# Patient Record
Sex: Female | Born: 1980 | Race: Black or African American | Hispanic: No | Marital: Single | Smoking: Current every day smoker
Health system: Southern US, Community
[De-identification: ages and names within clinical notes are randomized; demographics above are authoritative.]

## PROBLEM LIST (undated history)

## (undated) HISTORY — PX: TUBAL LIGATION: SHX77

---

## 2004-11-03 ENCOUNTER — Emergency Department: Payer: Self-pay | Admitting: Emergency Medicine

## 2005-03-12 ENCOUNTER — Emergency Department: Payer: Self-pay | Admitting: Emergency Medicine

## 2005-09-24 ENCOUNTER — Emergency Department: Payer: Self-pay | Admitting: General Practice

## 2006-06-29 ENCOUNTER — Emergency Department: Payer: Self-pay | Admitting: Unknown Physician Specialty

## 2006-09-29 ENCOUNTER — Emergency Department: Payer: Self-pay | Admitting: Emergency Medicine

## 2006-12-24 ENCOUNTER — Emergency Department: Payer: Self-pay | Admitting: Emergency Medicine

## 2006-12-27 ENCOUNTER — Emergency Department: Payer: Self-pay | Admitting: Emergency Medicine

## 2007-05-25 ENCOUNTER — Emergency Department: Payer: Self-pay | Admitting: Emergency Medicine

## 2007-11-24 ENCOUNTER — Emergency Department: Payer: Self-pay | Admitting: Internal Medicine

## 2008-06-11 ENCOUNTER — Emergency Department: Payer: Self-pay | Admitting: Emergency Medicine

## 2009-07-15 ENCOUNTER — Emergency Department: Payer: Self-pay | Admitting: Emergency Medicine

## 2009-11-24 ENCOUNTER — Emergency Department (HOSPITAL_COMMUNITY): Admission: EM | Admit: 2009-11-24 | Discharge: 2009-11-24 | Payer: Self-pay | Admitting: Family Medicine

## 2010-03-20 ENCOUNTER — Emergency Department (HOSPITAL_COMMUNITY): Admission: EM | Admit: 2010-03-20 | Discharge: 2010-03-20 | Payer: Self-pay | Admitting: Emergency Medicine

## 2011-01-07 ENCOUNTER — Emergency Department: Payer: Self-pay | Admitting: Emergency Medicine

## 2011-03-02 ENCOUNTER — Emergency Department: Payer: Self-pay | Admitting: Unknown Physician Specialty

## 2011-07-09 ENCOUNTER — Emergency Department: Payer: Self-pay | Admitting: Internal Medicine

## 2013-05-29 ENCOUNTER — Emergency Department: Payer: Self-pay | Admitting: Emergency Medicine

## 2013-06-18 ENCOUNTER — Inpatient Hospital Stay: Payer: Self-pay | Admitting: Psychiatry

## 2013-06-18 LAB — COMPREHENSIVE METABOLIC PANEL
Albumin: 4.1 g/dL (ref 3.4–5.0)
Anion Gap: 4 — ABNORMAL LOW (ref 7–16)
BUN: 11 mg/dL (ref 7–18)
Calcium, Total: 8.9 mg/dL (ref 8.5–10.1)
Glucose: 109 mg/dL — ABNORMAL HIGH (ref 65–99)
Osmolality: 276 (ref 275–301)
SGPT (ALT): 45 U/L (ref 12–78)
Sodium: 138 mmol/L (ref 136–145)
Total Protein: 8 g/dL (ref 6.4–8.2)

## 2013-06-18 LAB — URINALYSIS, COMPLETE
Bilirubin,UR: NEGATIVE
Hyaline Cast: 2
Nitrite: NEGATIVE
Ph: 5 (ref 4.5–8.0)
Protein: NEGATIVE
RBC,UR: 1 /HPF (ref 0–5)
Squamous Epithelial: 2

## 2013-06-18 LAB — TSH: Thyroid Stimulating Horm: 1.09 u[IU]/mL

## 2013-06-18 LAB — DRUG SCREEN, URINE
Amphetamines, Ur Screen: NEGATIVE (ref ?–1000)
Benzodiazepine, Ur Scrn: NEGATIVE (ref ?–200)
Cannabinoid 50 Ng, Ur ~~LOC~~: NEGATIVE (ref ?–50)
MDMA (Ecstasy)Ur Screen: NEGATIVE (ref ?–500)
Tricyclic, Ur Screen: NEGATIVE (ref ?–1000)

## 2013-06-18 LAB — ETHANOL: Ethanol %: <0.003 % (ref 0.000–0.080)

## 2013-06-18 LAB — CBC: MCH: 28.7 pg (ref 26.0–34.0)

## 2013-06-22 ENCOUNTER — Ambulatory Visit: Payer: Self-pay

## 2013-06-22 ENCOUNTER — Emergency Department: Payer: Self-pay | Admitting: Emergency Medicine

## 2013-06-24 ENCOUNTER — Ambulatory Visit: Payer: Self-pay

## 2014-02-08 LAB — URINALYSIS W/ REFLEX CULTURE
Bacteria: NEGATIVE /hpf
Bilirubin: NEGATIVE
Glucose: NEGATIVE mg/dL
Ketone: NEGATIVE mg/dL
Nitrites: NEGATIVE
Protein: NEGATIVE mg/dL
Specific gravity: 1.019 (ref 1.003–1.030)
Urobilinogen: 0.2 EU/dL (ref 0.2–1.0)
pH (UA): 7 (ref 5.0–8.0)

## 2014-02-08 LAB — HCG URINE, QL: HCG urine, QL: NEGATIVE

## 2014-02-08 NOTE — ED Provider Notes (Signed)
Patient is a 33 y.o. female presenting with urinary pain.   Urinary Pain          No past medical history on file.     No past surgical history on file.      No family history on file.     History     Social History   ??? Marital Status: N/A     Spouse Name: N/A     Number of Children: N/A   ??? Years of Education: N/A     Occupational History   ??? Not on file.     Social History Main Topics   ??? Smoking status: Not on file   ??? Smokeless tobacco: Not on file   ??? Alcohol Use: Not on file   ??? Drug Use: Not on file   ??? Sexual Activity: Not on file     Other Topics Concern   ??? Not on file     Social History Narrative   ??? No narrative on file                  ALLERGIES: Review of patient's allergies indicates no known allergies.      Review of Systems    Filed Vitals:    02/08/14 1056   BP: 123/78   Pulse: 74   Temp: 98.1 ??F (36.7 ??C)   Resp: 18   Weight: 85.2 kg (187 lb 13.3 oz)   SpO2: 100%            Physical Exam     MDM    Procedures  1:34 PM    I was inadvertently assigned to this patient's treatment team.  I did not see this patient nor did I have any contact with this patient.  I had no involvement during the evaluation, treatment or disposition of this patient.  I am signing off this note to indicate only why my name appeared in the record.  Blenda Nicelyiffany Tyron Manetta, [place]

## 2014-02-08 NOTE — ED Notes (Signed)
Patient to treatment room for third attempt. Patient assumed to have eloped.

## 2014-02-08 NOTE — ED Notes (Signed)
Called twice to treatment room. No response.

## 2014-02-11 NOTE — Progress Notes (Signed)
02/11/14  Pt provided a urine sample while in triage during her 02/08/14 visit. Pt was called multiple times to be roomed. Pt LWBS. She did not notify anyone prior to leaving. There is no contact phone number or address listed in chart.  Spoke w/ lab. Urine culture was not performed although reflex culture was ordered w/ urinalysis  Negative pregnancy test.     There is no contact information for pt. Unable to send letter due to no address on file and unable to call pt due to no phone number on file. Pt also does not have a PCP listed, so I am also unable to contact PCP or pt re: results    Spoke w/ lab again, they will culture pt's urine in case she returns to the ED

## 2014-02-12 NOTE — Progress Notes (Signed)
Quick Note:    LWBS, no abx.   Dede QueryLacey Shamecca Whitebread, PA    ______

## 2014-02-13 LAB — CULTURE, URINE
Colonies Counted: 6000
Colony Count: 6000

## 2014-02-13 NOTE — Progress Notes (Signed)
Quick Note:    Only 6,000 colonies. No patient information on file, unable to contact.   Dede QueryLacey Niti Leisure, PA    ______
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
## 2015-04-18 ENCOUNTER — Emergency Department
Admission: EM | Admit: 2015-04-18 | Discharge: 2015-04-18 | Disposition: A | Discharge status: Home / Self Care | Payer: No Typology Code available for payment source | Attending: Emergency Medicine | Admitting: Emergency Medicine

## 2015-04-18 ENCOUNTER — Encounter: Payer: Self-pay | Admitting: Emergency Medicine

## 2015-04-18 DIAGNOSIS — M7918 Myalgia, other site: Secondary | ICD-10-CM

## 2015-04-18 DIAGNOSIS — S161XXA Strain of muscle, fascia and tendon at neck level, initial encounter: Secondary | ICD-10-CM

## 2015-04-18 DIAGNOSIS — Y998 Other external cause status: Secondary | ICD-10-CM | POA: Diagnosis not present

## 2015-04-18 DIAGNOSIS — Y92410 Unspecified street and highway as the place of occurrence of the external cause: Secondary | ICD-10-CM | POA: Insufficient documentation

## 2015-04-18 DIAGNOSIS — Y9389 Activity, other specified: Secondary | ICD-10-CM | POA: Insufficient documentation

## 2015-04-18 DIAGNOSIS — Z720 Tobacco use: Secondary | ICD-10-CM | POA: Diagnosis not present

## 2015-04-18 DIAGNOSIS — S199XXA Unspecified injury of neck, initial encounter: Secondary | ICD-10-CM | POA: Diagnosis present

## 2015-04-18 MED ORDER — CYCLOBENZAPRINE HCL 10 MG PO TABS: 10.0000 mg | ORAL_TABLET | Freq: Three times a day (TID) | ORAL | Status: DC | PRN

## 2015-04-18 MED ORDER — NAPROXEN 500 MG PO TABS
500.0000 mg | ORAL_TABLET | Freq: Once | ORAL | Status: AC
  Administered 2015-04-18: 500 mg via ORAL

## 2015-04-18 MED ORDER — NAPROXEN 500 MG PO TABS
500.0000 mg | ORAL_TABLET | Freq: Three times a day (TID) | ORAL | Status: DC
Start: 2015-04-18 — End: 2018-03-18

## 2015-04-18 MED ORDER — CYCLOBENZAPRINE HCL 10 MG PO TABS
ORAL_TABLET | ORAL | Status: AC
  Administered 2015-04-18: 10 mg via ORAL
  Filled 2015-04-18: qty 1

## 2015-04-18 MED ORDER — NAPROXEN 500 MG PO TABS
ORAL_TABLET | ORAL | Status: AC
  Administered 2015-04-18: 500 mg via ORAL
  Filled 2015-04-18: qty 1

## 2015-04-18 MED ORDER — CYCLOBENZAPRINE HCL 10 MG PO TABS
10.0000 mg | ORAL_TABLET | Freq: Once | ORAL | Status: AC
  Administered 2015-04-18: 10 mg via ORAL

## 2015-04-18 NOTE — ED Notes (Signed)
Pt alert and oriented X4, active, cooperative, pt in NAD. RR even and unlabored, color WNL.  Pt informed to return if any life threatening symptoms occur.   
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
## 2015-04-18 NOTE — Discharge Instructions (Signed)
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
## 2015-09-30 ENCOUNTER — Encounter (HOSPITAL_COMMUNITY): Payer: Self-pay | Admitting: Nurse Practitioner

## 2015-09-30 ENCOUNTER — Emergency Department (HOSPITAL_COMMUNITY)
Admission: EM | Admit: 2015-09-30 | Discharge: 2015-09-30 | Disposition: A | Discharge status: Home / Self Care | Payer: Medicaid Other | Attending: Emergency Medicine | Admitting: Emergency Medicine

## 2015-09-30 DIAGNOSIS — X58XXXA Exposure to other specified factors, initial encounter: Secondary | ICD-10-CM | POA: Diagnosis not present

## 2015-09-30 DIAGNOSIS — T148 Other injury of unspecified body region: Secondary | ICD-10-CM | POA: Insufficient documentation

## 2015-09-30 DIAGNOSIS — R200 Anesthesia of skin: Secondary | ICD-10-CM | POA: Diagnosis not present

## 2015-09-30 DIAGNOSIS — Y9289 Other specified places as the place of occurrence of the external cause: Secondary | ICD-10-CM | POA: Diagnosis not present

## 2015-09-30 DIAGNOSIS — Y998 Other external cause status: Secondary | ICD-10-CM | POA: Insufficient documentation

## 2015-09-30 DIAGNOSIS — M79601 Pain in right arm: Secondary | ICD-10-CM | POA: Diagnosis present

## 2015-09-30 DIAGNOSIS — IMO0002 Reserved for concepts with insufficient information to code with codable children: Secondary | ICD-10-CM

## 2015-09-30 DIAGNOSIS — R202 Paresthesia of skin: Secondary | ICD-10-CM | POA: Insufficient documentation

## 2015-09-30 DIAGNOSIS — F17200 Nicotine dependence, unspecified, uncomplicated: Secondary | ICD-10-CM | POA: Insufficient documentation

## 2015-09-30 DIAGNOSIS — Z791 Long term (current) use of non-steroidal anti-inflammatories (NSAID): Secondary | ICD-10-CM | POA: Insufficient documentation

## 2015-09-30 DIAGNOSIS — Y9389 Activity, other specified: Secondary | ICD-10-CM | POA: Diagnosis not present

## 2015-09-30 DIAGNOSIS — T148XXA Other injury of unspecified body region, initial encounter: Secondary | ICD-10-CM

## 2015-09-30 MED ORDER — PREDNISONE 20 MG PO TABS: 40.0000 mg | ORAL_TABLET | Freq: Every day | ORAL | Status: DC

## 2015-09-30 MED ORDER — IBUPROFEN 800 MG PO TABS: 800.0000 mg | ORAL_TABLET | Freq: Three times a day (TID) | ORAL | Status: DC

## 2015-09-30 NOTE — ED Notes (Signed)
She c/o 2 month history of R neck pain radiating into R arm. Describes as a "shooting" pain. Over past 3 days she has begun to feel some intermittent numbness in R thumb and pointer fingertips. Pt was seen at another hospital 2 months ago for same pain with dx of muscle strain, she does not remember any injuries prior to onset of pain. She has been doing exercises given to her by hospital  with no relief of pain. A&ox4, ambulatory, mae.

## 2015-09-30 NOTE — ED Provider Notes (Signed)
CSN: [id number]     Arrival date & time 09/30/15  1159 History  By signing my name below, I, Elon SpannerGarrett Cook, attest that this documentation has been prepared under the direction and in the presence of Joycie PeekBenjamin Kysha Muralles. Electronically Signed: Elon SpannerGarrett Cook ED Scribe. 09/30/2015. 1:59 PM.  Chief Complaint  Patient presents with  . Arm Pain   The history is provided by the patient. No language interpreter was used.   HPI Comments: Sheena Petty is a 34 y.o. female who presents to the Emergency Department complaining of right-sided neck pain with intermittent tingling pain down the right arm into the fingers onset 2 months ago that became constant 4 days ago.   Certain motions of the neck cause shooting pain down the right arm.  She has used ibuprofen without relief.  Patient reports she was seen two months ago at another hospital and diagnosed with strain in her neck.  She denies injury, weakness, fever.     History reviewed. No pertinent past medical history. Past Surgical History  Procedure Laterality Date  . Cesarean section    . Tubal ligation     History reviewed. No pertinent family history. Social History  Substance Use Topics  . Smoking status: Current Some Day Smoker  . Smokeless tobacco: None  . Alcohol Use: Yes   OB History    No data available     Review of Systems  A complete 10 system review of systems was obtained and all systems are negative except as noted in the HPI and PMH.     Allergies  Review of patient's allergies indicates no known allergies.  Home Medications   Prior to Admission medications   Medication Sig Start Date End Date Taking? Authorizing Provider  cyclobenzaprine (FLEXERIL) 10 MG tablet Take 1 tablet (10 mg total) by mouth 3 (three) times daily as needed for muscle spasms. 04/18/15   Chinita Pesterari B Triplett, FNP  ibuprofen (ADVIL,MOTRIN) 800 MG tablet Take 1 tablet (800 mg total) by mouth 3 (three) times daily. 09/30/15   Joycie PeekBenjamin Dezi Brauner, PA-C   naproxen (NAPROSYN) 500 MG tablet Take 1 tablet (500 mg total) by mouth 3 (three) times daily with meals. 04/18/15   Chinita Pesterari B Triplett, FNP  predniSONE (DELTASONE) 20 MG tablet Take 2 tablets (40 mg total) by mouth daily. 09/30/15   Joycie PeekBenjamin Mckinnley Smithey, PA-C   BP 107/72 mmHg  Pulse 72  Temp(Src) 98 F (36.7 C) (Oral)  Resp 18  Ht 5\' 2"  (1.575 m)  Wt 82.283 kg  BMI 33.17 kg/m2  SpO2 96% Physical Exam  Constitutional: She is oriented to person, place, and time. She appears well-developed and well-nourished. No distress.  HENT:  Head: Normocephalic and atraumatic.  Eyes: Conjunctivae and EOM are normal.  Neck: Normal range of motion. Neck supple. No tracheal deviation present.  No carotid bruits.   Cardiovascular: Normal rate, regular rhythm, normal heart sounds and intact distal pulses.   Pulmonary/Chest: Effort normal. No respiratory distress.  Musculoskeletal: Normal range of motion.  c-spine, t-spine normal with no bony tenderness. Mild TTP of right scalenes.  Neurological: She is alert and oriented to person, place, and time.  Negative Tinel's. Motor strength appears baseline, grip strength intact and equal bilaterally. Sensation intact to light touch.  Skin: Skin is warm and dry. No rash noted.  Psychiatric: She has a normal mood and affect. Her behavior is normal.  Nursing note and vitals reviewed.   ED Course  Procedures (including critical care time)  DIAGNOSTIC STUDIES:  Oxygen Saturation is 98% on RA, normal by my interpretation.    COORDINATION OF CARE:   1:32 PM Will prescribe ibuprofen and oral steroids.  Patient acknowledges and agrees with plan.    Labs Review Labs Reviewed - No data to display  Imaging Review No results found. I have personally reviewed and evaluated these images and lab results as part of my medical decision-making.   EKG Interpretation None     Meds given in ED:  Medications - No data to display  Discharge Medication List as of  09/30/2015  1:35 PM    START taking these medications   Details  ibuprofen (ADVIL,MOTRIN) 800 MG tablet Take 1 tablet (800 mg total) by mouth 3 (three) times daily., Starting 09/30/2015, Until Discontinued, Print    predniSONE (DELTASONE) 20 MG tablet Take 2 tablets (40 mg total) by mouth daily., Starting 09/30/2015, Until Discontinued, Print       Filed Vitals:   09/30/15 1213 09/30/15 1335  BP: 126/85 107/72  Pulse: 76 72  Temp: 98.2 F (36.8 C) 98 F (36.7 C)  TempSrc: Oral Oral  Resp: 16 18  Height:  (1.575 m)   Weight: 82.283 kg   SpO2: 98% 96%    MDM  Sheena Petty is a 34 y.o. female comes in for evaluation of right-sided neck pain and intermittent sharp and tingling pain in her right fingers. On arrival, she is hemodynamically stable, vital signs normal, afebrile. On exam, she is neurovascularly intact, maintains full active range of motion. No midline cervical bony tenderness, full range of motion of cervical, thoracic and lumbar spine. She does have mild tenderness to palpation of her right scalenes. Suspect some degree of muscle strain. Patient reports she only takes "2 or 3 ibuprofen ". Plan to administer steroid taper and increased dose of ibuprofen and have patient follow-up with PCP. Referral given to [facility] and wellness. Patient verbalizes understanding. Voices no other questions or concerns this time. Overall, she appears well, he wouldn't stable. Low suspicion for acute or emergent spinal cord pathology. Stable for discharge Final diagnoses:  Muscle strain  Occasional numbness/prickling/tingling of fingers and toes    I personally performed the services described in this documentation, which was scribed in my presence. The recorded information has been reviewed and is accurate.     Joycie Peek, PA-C 09/30/15 1400  Gerhard Munch, MD 10/06/15 [phone]

## 2015-09-30 NOTE — Discharge Instructions (Signed)
Please take your medications as prescribed. Do not take your ibuprofen with Aleve, naproxen or Advil as these medications worked similarly. Take your prednisone as prescribed. Follow-up with your doctor or the [facility] and wellness facility for follow-up primary care. Return to ED for any new or worsening symptoms.  Paresthesia Paresthesia is an abnormal burning or prickling sensation. This sensation is generally felt in the hands, arms, legs, or feet. However, it may occur in any part of the body. Usually, it is not painful. The feeling may be described as:  Tingling or numbness.  Pins and needles.  Skin crawling.  Buzzing.  Limbs falling asleep.  Itching. Most people experience temporary (transient) paresthesia at some time in their lives. Paresthesia may occur when you breathe too quickly (hyperventilation). It can also occur without any apparent cause. Commonly, paresthesia occurs when pressure is placed on a nerve. The sensation quickly goes away after the pressure is removed. For some people, however, paresthesia is a long-lasting (chronic) condition that is caused by an underlying disorder. If you continue to have paresthesia, you may need further medical evaluation. HOME CARE INSTRUCTIONS Watch your condition for any changes. Taking the following actions may help to lessen any discomfort that you are feeling:  Avoid drinking alcohol.  Try acupuncture or massage to help relieve your symptoms.  Keep all follow-up visits as directed by your health care provider. This is important. SEEK MEDICAL CARE IF:  You continue to have episodes of paresthesia.  Your burning or prickling feeling gets worse when you walk.  You have pain, cramps, or dizziness.  You develop a rash. SEEK IMMEDIATE MEDICAL CARE IF:  You feel weak.  You have trouble walking or moving.  You have problems with speech, understanding, or vision.  You feel confused.  You cannot control your bladder or  bowel movements.  You have numbness after an injury.  You faint.   This information is not intended to replace advice given to you by your health care provider. Make sure you discuss any questions you have with your health care provider.   Document Released: 10/10/2002 Document Revised: 03/06/2015 Document Reviewed: 10/16/2014 Elsevier Interactive Patient Education [employer]2016 Elsevier Inc.

## 2017-02-12 ENCOUNTER — Emergency Department (HOSPITAL_COMMUNITY): Payer: Medicaid Other

## 2017-02-12 ENCOUNTER — Emergency Department (HOSPITAL_COMMUNITY)
Admission: EM | Admit: 2017-02-12 | Discharge: 2017-02-12 | Disposition: A | Discharge status: Home / Self Care | Payer: Medicaid Other | Attending: Emergency Medicine | Admitting: Emergency Medicine

## 2017-02-12 ENCOUNTER — Encounter (HOSPITAL_COMMUNITY): Payer: Self-pay

## 2017-02-12 DIAGNOSIS — Y929 Unspecified place or not applicable: Secondary | ICD-10-CM | POA: Insufficient documentation

## 2017-02-12 DIAGNOSIS — Y999 Unspecified external cause status: Secondary | ICD-10-CM | POA: Insufficient documentation

## 2017-02-12 DIAGNOSIS — K5900 Constipation, unspecified: Secondary | ICD-10-CM

## 2017-02-12 DIAGNOSIS — Y939 Activity, unspecified: Secondary | ICD-10-CM | POA: Insufficient documentation

## 2017-02-12 DIAGNOSIS — F17200 Nicotine dependence, unspecified, uncomplicated: Secondary | ICD-10-CM | POA: Insufficient documentation

## 2017-02-12 DIAGNOSIS — S39011A Strain of muscle, fascia and tendon of abdomen, initial encounter: Secondary | ICD-10-CM | POA: Insufficient documentation

## 2017-02-12 DIAGNOSIS — X58XXXA Exposure to other specified factors, initial encounter: Secondary | ICD-10-CM | POA: Insufficient documentation

## 2017-02-12 LAB — COMPREHENSIVE METABOLIC PANEL
ALT: 18 U/L (ref 14–54)
ANION GAP: 7 (ref 5–15)
AST: 21 U/L (ref 15–41)
Albumin: 4.1 g/dL (ref 3.5–5.0)
Alkaline Phosphatase: 63 U/L (ref 38–126)
BUN: 11 mg/dL (ref 6–20)
CHLORIDE: 104 mmol/L (ref 101–111)
CO2: 27 mmol/L (ref 22–32)
CREATININE: 0.82 mg/dL (ref 0.44–1.00)
Calcium: 8.7 mg/dL — ABNORMAL LOW (ref 8.9–10.3)
Glucose, Bld: 101 mg/dL — ABNORMAL HIGH (ref 65–99)
POTASSIUM: 3.5 mmol/L (ref 3.5–5.1)
SODIUM: 138 mmol/L (ref 135–145)
Total Bilirubin: 0.5 mg/dL (ref 0.3–1.2)
Total Protein: 6.7 g/dL (ref 6.5–8.1)

## 2017-02-12 LAB — URINALYSIS, ROUTINE W REFLEX MICROSCOPIC
Bacteria, UA: NONE SEEN
Bilirubin Urine: NEGATIVE
GLUCOSE, UA: NEGATIVE mg/dL
KETONES UR: NEGATIVE mg/dL
LEUKOCYTES UA: NEGATIVE
Nitrite: NEGATIVE
PH: 6 (ref 5.0–8.0)
Protein, ur: NEGATIVE mg/dL
Specific Gravity, Urine: 1.018 (ref 1.005–1.030)

## 2017-02-12 LAB — CBC
HCT: 36.9 % (ref 36.0–46.0)
HEMOGLOBIN: 11.7 g/dL — AB (ref 12.0–15.0)
MCH: 27.4 pg (ref 26.0–34.0)
MCHC: 31.7 g/dL (ref 30.0–36.0)
MCV: 86.4 fL (ref 78.0–100.0)
PLATELETS: 289 10*3/uL (ref 150–400)
RBC: 4.27 MIL/uL (ref 3.87–5.11)
RDW: 14 % (ref 11.5–15.5)
WBC: 12.7 10*3/uL — AB (ref 4.0–10.5)

## 2017-02-12 LAB — I-STAT BETA HCG BLOOD, ED (MC, WL, AP ONLY): I-stat hCG, quantitative: <5 m[IU]/mL (ref <5)

## 2017-02-12 LAB — LIPASE, BLOOD: LIPASE: 28 U/L (ref 11–51)

## 2017-02-12 MED ORDER — CYCLOBENZAPRINE HCL 5 MG PO TABS: 5.0000 mg | ORAL_TABLET | Freq: Three times a day (TID) | ORAL | 0 refills | Status: DC | PRN

## 2017-02-12 MED ORDER — MAGNESIUM CITRATE PO SOLN: 1.0000 | Freq: Once | ORAL | 1 refills | Status: AC

## 2017-02-12 NOTE — Discharge Instructions (Signed)
X-ray shows that you have constipation.  Her physical exam is consistent with a slight muscle strain of your abdominal wall to be given a prescription for Flexeril 5 mg, take 1 tablet 3 times a day.  Try to avoid heavy lifting for the next 7-10 days. You've also been given a prescription for magnesium citrate.  Please take this with a full glass of water and this should help relieve some of your constipation tried add more roughage to your diet and drink more water

## 2017-02-12 NOTE — ED Provider Notes (Signed)
[facility]-EMERGENCY DEPT Provider Note   CSN: [id number] Arrival date & time: 02/12/17  1721     History   Chief Complaint Chief Complaint  Patient presents with  . Abdominal Pain    HPI Sheena Petty is a 36 y.o. female.  This a normally healthy 36 year old female who is moderately obese.  He states for the past 2 weeks.  She's noticed a sore area in her mid abdomen, worse when she lifts or in certain positions.  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  She has not taken any medication for discomfort. She reports no diarrhea, no constipation, no dysuria, no vaginal discharge.  She states that she does have one or 2 episodes of vomiting a week after eating certain foods, but nothing on a regular basis.       History reviewed. No pertinent past medical history.  There are no active problems to display for this patient.   Past Surgical History:  Procedure Laterality Date  . CESAREAN SECTION    . TUBAL LIGATION      OB History    No data available       Home Medications    Prior to Admission medications   Medication Sig Start Date End Date Taking? Authorizing Provider  cyclobenzaprine (FLEXERIL) 5 MG tablet Take 1 tablet (5 mg total) by mouth 3 (three) times daily as needed for muscle spasms. 02/12/17   Earley Favor, NP  ibuprofen (ADVIL,MOTRIN) 800 MG tablet Take 1 tablet (800 mg total) by mouth 3 (three) times daily. 09/30/15   Joycie Peek, PA-C  magnesium citrate SOLN Take 296 mLs (1 Bottle total) by mouth once. 02/12/17 02/12/17  Earley Favor, NP  naproxen (NAPROSYN) 500 MG tablet Take 1 tablet (500 mg total) by mouth 3 (three) times daily with meals. 04/18/15   Chinita Pester, FNP  predniSONE (DELTASONE) 20 MG tablet Take 2 tablets (40 mg total) by mouth daily. 09/30/15   Joycie Peek, PA-C    Family History No family history on file.  Social History Social History  Substance Use Topics  . Smoking status: Current Some Day Smoker   . Smokeless tobacco: Never Used  . Alcohol use Yes     Allergies   Patient has no known allergies.   Review of Systems Review of Systems  Constitutional: Negative for appetite change.  HENT: Negative.   Eyes: Negative.   Respiratory: Negative for shortness of breath.   Cardiovascular: Negative for chest pain.  Gastrointestinal: Positive for abdominal pain and vomiting. Negative for constipation, diarrhea, nausea and rectal pain.  Endocrine: Negative.   Genitourinary: Negative for dysuria, vaginal bleeding and vaginal discharge.  Musculoskeletal: Negative for back pain, myalgias and neck pain.  Neurological: Negative for weakness and headaches.  Psychiatric/Behavioral: Negative.   All other systems reviewed and are negative.    Physical Exam Updated Vital Signs BP 117/83   Pulse 73   Temp 98.4 F (36.9 C) (Oral)   Resp 16   Ht  (1.575 m)   Wt 81.6 kg   LMP 02/01/2017 Comment: patient states tubes clampped  SpO2 100%   BMI 32.92 kg/m   Physical Exam  Constitutional: She appears well-developed and well-nourished.  HENT:  Head: Normocephalic.  Nose: Nose normal.  Eyes: Pupils are equal, round, and reactive to light.  Neck: Normal range of motion.  Cardiovascular: Normal rate.   Pulmonary/Chest: Effort normal.  Abdominal: Soft. She exhibits no distension.  Musculoskeletal: Normal range of motion.  Neurological: She is alert.  Skin: Skin is warm.  Psychiatric: She has a normal mood and affect.  Nursing note and vitals reviewed.    ED Treatments / Results  Labs (all labs ordered are listed, but only abnormal results are displayed) Labs Reviewed  COMPREHENSIVE METABOLIC PANEL - Abnormal; Notable for the following:       Result Value   Glucose, Bld 101 (*)    Calcium 8.7 (*)    All other components within normal limits  CBC - Abnormal; Notable for the following:    WBC 12.7 (*)    Hemoglobin 11.7 (*)    All other components within normal limits    URINALYSIS, ROUTINE W REFLEX MICROSCOPIC - Abnormal; Notable for the following:    Hgb urine dipstick MODERATE (*)    Squamous Epithelial / LPF 0-5 (*)    All other components within normal limits  LIPASE, BLOOD  I-STAT BETA HCG BLOOD, ED ([facility], [facility], [facility] ONLY)    EKG  EKG Interpretation None       Radiology Dg Abdomen 1 View  Result Date: 02/12/2017 CLINICAL DATA:  Mid abdominal pain EXAM: ABDOMEN - 1 VIEW COMPARISON:  None. FINDINGS: The bowel gas pattern is normal. A moderate degree of fecal retention is seen within large bowel. Tubal ligation clips are seen within the pelvis bilaterally. Small phleboliths are seen within the pelvis bilaterally as well. A moderate No radio-opaque calculi or other significant radiographic abnormality are seen. IMPRESSION: No bowel obstruction. Moderate fecal retention within large bowel question constipation. Electronically Signed   By: Tollie Eth M.D.   On: 02/12/2017 21:46    Procedures Procedures (including critical care time)  Medications Ordered in ED Medications - No data to display   Initial Impression / Assessment and Plan / ED Course  I have reviewed the triage vital signs and the nursing notes.  Pertinent labs & imaging results that were available during my care of the patient were reviewed by me and considered in my medical decision making (see chart for details).     \ Knee shows significant constipation.  Patient has been given a prescription for magnesium citrate also feel that she has a muscle strain of her abdominal wall and a put on weight restriction at work, not lifting more than 25 pounds in the next 7-10 days with a prescription for Flexeril  Final Clinical Impressions(s) / ED Diagnoses   Final diagnoses:  Constipation, unspecified constipation type  Abdominal muscle strain, initial encounter    New Prescriptions New Prescriptions   MAGNESIUM CITRATE SOLN    Take 296 mLs (1 Bottle total) by mouth once.     Earley Favor, NP 02/12/17 1191    Arby Barrette, MD 02/16/17 1048

## 2017-02-12 NOTE — ED Triage Notes (Signed)
Pt reports central constant sharp abdominal pain "it feels like a knot" onset one week ago associated with nausea. She denies vomiting or diarrhea. LBM yesterday and states it was normal.

## 2017-09-12 ENCOUNTER — Emergency Department (HOSPITAL_COMMUNITY)
Admission: EM | Admit: 2017-09-12 | Discharge: 2017-09-12 | Disposition: A | Discharge status: Home / Self Care | Payer: Self-pay | Attending: Emergency Medicine | Admitting: Emergency Medicine

## 2017-09-12 ENCOUNTER — Other Ambulatory Visit: Payer: Self-pay

## 2017-09-12 DIAGNOSIS — F17210 Nicotine dependence, cigarettes, uncomplicated: Secondary | ICD-10-CM | POA: Insufficient documentation

## 2017-09-12 DIAGNOSIS — F4321 Adjustment disorder with depressed mood: Secondary | ICD-10-CM

## 2017-09-12 DIAGNOSIS — Z79899 Other long term (current) drug therapy: Secondary | ICD-10-CM | POA: Insufficient documentation

## 2017-09-12 DIAGNOSIS — F410 Panic disorder [episodic paroxysmal anxiety] without agoraphobia: Secondary | ICD-10-CM

## 2017-09-12 DIAGNOSIS — F4320 Adjustment disorder, unspecified: Secondary | ICD-10-CM | POA: Insufficient documentation

## 2017-09-12 NOTE — ED Triage Notes (Signed)
Pt bib family and abruptly lost her two sons last night.  Pt reports needing medication for her nerves. Pt a/o 4 and ambulatory.

## 2017-09-12 NOTE — ED Notes (Signed)
Attempted to give discharge instructions to pt.  Pt not in room.

## 2017-09-12 NOTE — ED Provider Notes (Signed)
[facility]-EMERGENCY DEPT Provider Note   CSN: [id number] Arrival date & time: 09/12/17  1216     History   Chief Complaint Chief Complaint  Patient presents with  . Anxiety    HPI Sheena Petty is a 36 y.o. female.  HPI  36 y.o. female, presents to the Emergency Department today due to panic attack and episode of grieving. Pt notes that she lost her sons this morning and is requesting medications for her nerves. Notes difficulty sleeping due to this. Denies pain. No CP/SOB/ABD pain. No N/V/D. No fevers. Notes that the news was hard for her and her family and is requesting help. No other symptoms noted    No past medical history on file.  There are no active problems to display for this patient.   Past Surgical History:  Procedure Laterality Date  . CESAREAN SECTION    . TUBAL LIGATION      OB History    No data available       Home Medications    Prior to Admission medications   Medication Sig Start Date End Date Taking? Authorizing Provider  cyclobenzaprine (FLEXERIL) 5 MG tablet Take 1 tablet (5 mg total) by mouth 3 (three) times daily as needed for muscle spasms. 02/12/17   Earley FavorSchulz, Gail, NP  ibuprofen (ADVIL,MOTRIN) 800 MG tablet Take 1 tablet (800 mg total) by mouth 3 (three) times daily. 09/30/15   Cartner, Sharlet SalinaBenjamin, PA-C  naproxen (NAPROSYN) 500 MG tablet Take 1 tablet (500 mg total) by mouth 3 (three) times daily with meals. 04/18/15   Triplett, Cari B, FNP  predniSONE (DELTASONE) 20 MG tablet Take 2 tablets (40 mg total) by mouth daily. 09/30/15   Joycie Peekartner, Benjamin, PA-C    Family History No family history on file.  Social History Social History   Tobacco Use  . Smoking status: Current Some Day Smoker  . Smokeless tobacco: Never Used  Substance Use Topics  . Alcohol use: Yes  . Drug use: No     Allergies   Patient has no known allergies.   Review of Systems Review of Systems ROS reviewed and all are negative for  acute change except as noted in the HPI.  Physical Exam Updated Vital Signs BP 118/85 (BP Location: Right Arm)   Pulse 83   Temp 98.3 F (36.8 C) (Oral)   Resp (!) 24   LMP 08/17/2017   SpO2 100%   Physical Exam  Constitutional: She is oriented to person, place, and time. She appears well-developed and well-nourished. No distress.  HENT:  Head: Normocephalic and atraumatic.  Right Ear: Tympanic membrane, external ear and ear canal normal.  Left Ear: Tympanic membrane, external ear and ear canal normal.  Nose: Nose normal.  Mouth/Throat: Uvula is midline, oropharynx is clear and moist and mucous membranes are normal. No trismus in the jaw. No oropharyngeal exudate, posterior oropharyngeal erythema or tonsillar abscesses.  Eyes: EOM are normal. Pupils are equal, round, and reactive to light.  Neck: Normal range of motion. Neck supple. No tracheal deviation present.  Cardiovascular: Normal rate, regular rhythm, S1 normal, S2 normal, normal heart sounds, intact distal pulses and normal pulses.  Pulmonary/Chest: Effort normal and breath sounds normal. No respiratory distress. She has no decreased breath sounds. She has no wheezes. She has no rhonchi. She has no rales.  Abdominal: Normal appearance and bowel sounds are normal. There is no tenderness.  Musculoskeletal: Normal range of motion.  Neurological: She is alert and oriented to  person, place, and time.  Skin: Skin is warm and dry.  Psychiatric: She has a normal mood and affect. Her speech is normal and behavior is normal. Thought content normal.     ED Treatments / Results  Labs (all labs ordered are listed, but only abnormal results are displayed) Labs Reviewed - No data to display  EKG  EKG Interpretation None       Radiology No results found.  Procedures Procedures (including critical care time)  Medications Ordered in ED Medications - No data to display   Initial Impression / Assessment and Plan / ED Course    I have reviewed the triage vital signs and the nursing notes.  Pertinent labs & imaging results that were available during my care of the patient were reviewed by me and considered in my medical decision making (see chart for details).  Final Clinical Impressions(s) / ED Diagnoses     {I have reviewed the relevant previous healthcare records.  {I obtained HPI from historian.   ED Course:  Assessment: Pt is a 36 y.o. female presents to the Emergency Department today due to panic attack and episode of grieving. Pt notes that she lost her sons this morning and is requesting medications for her nerves. Notes difficulty sleeping due to this. Denies pain. No CP/SOB/ABD pain. No N/V/D. No fevers. Notes that the news was hard for her and her family and is requesting help. On exam, pt in NAD. Nontoxic/nonseptic appearing. VSS. Afebrile. Lungs CTA. Heart RRR. Likely brief panic attack given grief. Denies other symptoms. Given outpatient grief counseling. Discussed techniques to assist in grief. Plan is to DC home with follow up to PCP. At time of discharge, Patient is in no acute distress. Vital Signs are stable. Patient is able to ambulate. Patient able to tolerate PO.   Disposition/Plan:  DC Home Additional Verbal discharge instructions given and discussed with patient.  Pt Instructed to f/u with PCP in the next week for evaluation and treatment of symptoms. Return precautions given Pt acknowledges and agrees with plan  Supervising Physician Arby BarrettePfeiffer, Marcy, MD  Final diagnoses:  Panic attack  Grief    ED Discharge Orders    None       Audry PiliMohr, Cordel Drewes, PA-C 09/12/17 1323    Arby BarrettePfeiffer, Marcy, MD 09/12/17 1520

## 2018-03-18 ENCOUNTER — Encounter (HOSPITAL_COMMUNITY): Payer: Self-pay | Admitting: Family Medicine

## 2018-03-18 ENCOUNTER — Ambulatory Visit (HOSPITAL_COMMUNITY)
Admission: EM | Admit: 2018-03-18 | Discharge: 2018-03-18 | Disposition: A | Discharge status: Home / Self Care | Payer: Self-pay | Attending: Family Medicine | Admitting: Family Medicine

## 2018-03-18 DIAGNOSIS — R0981 Nasal congestion: Secondary | ICD-10-CM

## 2018-03-18 DIAGNOSIS — M546 Pain in thoracic spine: Secondary | ICD-10-CM

## 2018-03-18 DIAGNOSIS — K529 Noninfective gastroenteritis and colitis, unspecified: Secondary | ICD-10-CM

## 2018-03-18 MED ORDER — CYCLOBENZAPRINE HCL 10 MG PO TABS: 10.0000 mg | ORAL_TABLET | Freq: Two times a day (BID) | ORAL | 0 refills | Status: DC | PRN

## 2018-03-18 MED ORDER — ONDANSETRON 4 MG PO TBDP: 4.0000 mg | ORAL_TABLET | Freq: Three times a day (TID) | ORAL | 0 refills | Status: DC | PRN

## 2018-03-18 NOTE — ED Provider Notes (Signed)
[facility]    CSN: [id number] Arrival date & time: 03/18/18  1132     History   Chief Complaint Chief Complaint  Patient presents with  . Emesis  . Diarrhea    HPI Sheena Petty is a 37 y.o. female no contributing past medical history presenting today for evaluation of multiple complaints.  Her main concern is she has been having nausea vomiting and diarrhea for the past 2 days.  Overall symptoms have been improving, she is no longer actively vomiting, but is still having some nausea.  Tolerating fluids, but is still not tolerating solid foods.  Diarrhea has also improved and is not as frequent.  Denies any abdominal pain at this time, states that she will occasionally have some irritation but nothing persistent.  Denies any blood in the vomit or stools.  Denies any fevers.  Also endorsing some neck and back pain that has been going on for the past week.  States that she recently switched jobs and has had increase in activity/lifting.  Has been taking ibuprofen 600 with minimal relief.  Denies any numbness or tingling or radiation into her arms.  Denies loss of bowel or bladder control, denies any saddle anesthesia.  Denies any changes in vision.  Patient also noting to have nasal congestion.  Denies any urinary symptoms of dysuria, increased frequency or urgency.  Denies any abnormal vaginal discharge.  Last menstrual period began 2 days ago.  HPI  History reviewed. No pertinent past medical history.  There are no active problems to display for this patient.   Past Surgical History:  Procedure Laterality Date  . CESAREAN SECTION    . TUBAL LIGATION      OB History   None      Home Medications    Prior to Admission medications   Medication Sig Start Date End Date Taking? Authorizing Provider  cyclobenzaprine (FLEXERIL) 10 MG tablet Take 1 tablet (10 mg total) by mouth 2 (two) times daily as needed for muscle spasms. 03/18/18   Lancelot Alyea C, PA-C    ondansetron (ZOFRAN ODT) 4 MG disintegrating tablet Take 1 tablet (4 mg total) by mouth every 8 (eight) hours as needed for nausea or vomiting. 03/18/18   Aleeta Schmaltz, Junius Creamer, PA-C    Family History History reviewed. No pertinent family history.  Social History Social History   Tobacco Use  . Smoking status: Current Some Day Smoker  . Smokeless tobacco: Never Used  Substance Use Topics  . Alcohol use: Yes  . Drug use: No     Allergies   Patient has no known allergies.   Review of Systems Review of Systems  Constitutional: Positive for appetite change. Negative for activity change, chills, fatigue and fever.  HENT: Positive for congestion and rhinorrhea. Negative for ear pain, sinus pressure, sore throat and trouble swallowing.   Eyes: Negative for pain and visual disturbance.  Respiratory: Positive for cough. Negative for chest tightness and shortness of breath.   Cardiovascular: Negative for chest pain.  Gastrointestinal: Positive for diarrhea, nausea and vomiting. Negative for abdominal pain.  Musculoskeletal: Positive for back pain, myalgias, neck pain and neck stiffness. Negative for arthralgias and gait problem.  Skin: Negative for color change, rash and wound.  Neurological: Negative for dizziness, seizures, syncope, weakness, light-headedness, numbness and headaches.     Physical Exam Triage Vital Signs ED Triage Vitals  Enc Vitals Group     BP 03/18/18 1154 (!) 113/95     Pulse Rate  03/18/18 1154 84     Resp 03/18/18 1154 18     Temp 03/18/18 1154 98.3 F (36.8 C)     Temp src --      SpO2 03/18/18 1154 100 %     Weight --      Height --      Head Circumference --      Peak Flow --      Pain Score 03/18/18 1152 0     Pain Loc --      Pain Edu? --      Excl. in GC? --    No data found.  Updated Vital Signs BP (!) 113/95   Pulse 84   Temp 98.3 F (36.8 C)   Resp 18   LMP 03/16/2018   SpO2 100%   Visual Acuity Right Eye Distance:   Left Eye  Distance:   Bilateral Distance:    Right Eye Near:   Left Eye Near:    Bilateral Near:     Physical Exam  Constitutional: She appears well-developed and well-nourished. No distress.  HENT:  Head: Normocephalic and atraumatic.  Mouth/Throat: Oropharynx is clear and moist.  Tongue does appear slightly fissured  Eyes: Pupils are equal, round, and reactive to light. Conjunctivae and EOM are normal.  Neck: Neck supple.  Cardiovascular: Normal rate and regular rhythm.  No murmur heard. Pulmonary/Chest: Effort normal and breath sounds normal. No respiratory distress.  Breathing comfortably at rest, CTABL, no wheezing, rales or other adventitious sounds auscultated   Abdominal: Soft. There is no tenderness.  Abdomen is soft, nondistended, nontender to light and deep palpation, negative rebound.  Musculoskeletal: She exhibits no edema.  Patient with tenderness to palpation around left scapular thoracic area.  Full active range of motion of neck.  Neurological: She is alert.  Skin: Skin is warm and dry.  Psychiatric: She has a normal mood and affect.  Nursing note and vitals reviewed.    UC Treatments / Results  Labs (all labs ordered are listed, but only abnormal results are displayed) Labs Reviewed - No data to display  EKG None  Radiology No results found.  Procedures Procedures (including critical care time)  Medications Ordered in UC Medications - No data to display  Initial Impression / Assessment and Plan / UC Course  I have reviewed the triage vital signs and the nursing notes.  Pertinent labs & imaging results that were available during my care of the patient were reviewed by me and considered in my medical decision making (see chart for details).     Nausea vomiting diarrhea that are improving, likely viral gastroenteritis.  Will recommend symptomatic management, Zofran provided.  Discussed importance of dehydration.  Back pain likely muscular, no injury.   Will defer imaging.  Will treat with anti-inflammatories, will add in Flexeril to use as needed, discussed sedation regarding this and advised to not use when working or to mainly reserve for bedtime.  Discussed over-the-counter recommendations for nasal congestion.  Discussed strict return precautions. Patient verbalized understanding and is agreeable with plan.  Final Clinical Impressions(s) / UC Diagnoses   Final diagnoses:  Gastroenteritis  Acute left-sided thoracic back pain  Nasal congestion     Discharge Instructions     Nausea/Vomiting/Diarrhea: Your nausea, vomiting, and diarrhea appear to have a viral cause. Your symptoms should improve over the next week as your body continues to rid the infectious cause.  For nausea: Zofran prescribed. Begin with every 6 hours, than as you are  able to hold food down, take it as needed. Start with clear liquids, then move to plain foods like bananas, rice, applesauce, toast, broth, grits, oatmeal. As those food settle okay you may transition to your normal foods. Avoid spicy and greasy foods as much as possible.  For Diarrhea: This is your body's natural way of getting rid of a virus. You may try taking 1 imodium to decrease amount of stools a day, but we do not want you to stop your diarrhea.   Preventing dehydration is key! You need to replace the fluid your body is expelling. Drink plenty of fluids, may use Pedialyte or sports drinks.   Please return if you are experiencing blood in your vomit or stool or experiencing dizziness, lightheadedness, extreme fatigue, increased abdominal pain.   Back/Neck Pain: Use anti-inflammatories for pain/swelling. You may take up to 800 mg Ibuprofen every 8 hours with food. You may supplement Ibuprofen with Tylenol [phone] mg every 8 hours.   You may use flexeril as needed to help with pain. This is a muscle relaxer and causes sedation- please use only at bedtime or when you will be home and not have to  drive/work  Ice and heating pad  Congestion: For congestion you may try a daily allergy pill like Zyrtec or Claritin, store brand is okay or you may try oral decongestants like Mucinex or Sudafed, or nasal sprays like Nasacort or Flonase.  Please return if symptoms worsening or develop difficulty breathing.   ED Prescriptions    Medication Sig Dispense Auth. Provider   ondansetron (ZOFRAN ODT) 4 MG disintegrating tablet Take 1 tablet (4 mg total) by mouth every 8 (eight) hours as needed for nausea or vomiting. 20 tablet Rosalin Buster C, PA-C   cyclobenzaprine (FLEXERIL) 10 MG tablet Take 1 tablet (10 mg total) by mouth 2 (two) times daily as needed for muscle spasms. 20 tablet Tangia Pinard, [place] C, PA-C     Controlled Substance Prescriptions [place] Controlled Substance Registry consulted? Not Applicable   Lew Dawes, [place] 03/18/18 1247

## 2018-03-18 NOTE — Discharge Instructions (Signed)
Nausea/Vomiting/Diarrhea: Your nausea, vomiting, and diarrhea appear to have a viral cause. Your symptoms should improve over the next week as your body continues to rid the infectious cause.  For nausea: Zofran prescribed. Begin with every 6 hours, than as you are able to hold food down, take it as needed. Start with clear liquids, then move to plain foods like bananas, rice, applesauce, toast, broth, grits, oatmeal. As those food settle okay you may transition to your normal foods. Avoid spicy and greasy foods as much as possible.  For Diarrhea: This is your body's natural way of getting rid of a virus. You may try taking 1 imodium to decrease amount of stools a day, but we do not want you to stop your diarrhea.   Preventing dehydration is key! You need to replace the fluid your body is expelling. Drink plenty of fluids, may use Pedialyte or sports drinks.   Please return if you are experiencing blood in your vomit or stool or experiencing dizziness, lightheadedness, extreme fatigue, increased abdominal pain.   Back/Neck Pain: Use anti-inflammatories for pain/swelling. You may take up to 800 mg Ibuprofen every 8 hours with food. You may supplement Ibuprofen with Tylenol [phone] mg every 8 hours.   You may use flexeril as needed to help with pain. This is a muscle relaxer and causes sedation- please use only at bedtime or when you will be home and not have to drive/work  Ice and heating pad  Congestion: For congestion you may try a daily allergy pill like Zyrtec or Claritin, store brand is okay or you may try oral decongestants like Mucinex or Sudafed, or nasal sprays like Nasacort or Flonase.  Please return if symptoms worsening or develop difficulty breathing.

## 2018-03-18 NOTE — ED Triage Notes (Addendum)
Pt here for N,V,D since Monday. Pt sts some neck and back tightness since last week.

## 2018-12-22 ENCOUNTER — Encounter (HOSPITAL_COMMUNITY): Payer: Self-pay | Admitting: Emergency Medicine

## 2018-12-22 ENCOUNTER — Other Ambulatory Visit: Payer: Self-pay

## 2018-12-22 ENCOUNTER — Emergency Department (HOSPITAL_COMMUNITY)
Admission: EM | Admit: 2018-12-22 | Discharge: 2018-12-22 | Disposition: A | Discharge status: Home / Self Care | Payer: Self-pay | Attending: Emergency Medicine | Admitting: Emergency Medicine

## 2018-12-22 DIAGNOSIS — Z5321 Procedure and treatment not carried out due to patient leaving prior to being seen by health care provider: Secondary | ICD-10-CM | POA: Insufficient documentation

## 2018-12-22 DIAGNOSIS — R1032 Left lower quadrant pain: Secondary | ICD-10-CM | POA: Insufficient documentation

## 2018-12-22 NOTE — ED Notes (Addendum)
Patient refused lab draw.

## 2018-12-22 NOTE — ED Triage Notes (Signed)
Pt c/o left lower abd pains for couple days. Denies n/v/d or urinary problems.

## 2019-01-19 ENCOUNTER — Other Ambulatory Visit: Payer: Self-pay

## 2019-01-19 ENCOUNTER — Encounter (HOSPITAL_COMMUNITY): Payer: Self-pay | Admitting: Family Medicine

## 2019-01-19 ENCOUNTER — Ambulatory Visit (HOSPITAL_COMMUNITY)
Admission: EM | Admit: 2019-01-19 | Discharge: 2019-01-19 | Disposition: A | Discharge status: Home / Self Care | Payer: Self-pay | Attending: Family Medicine | Admitting: Family Medicine

## 2019-01-19 DIAGNOSIS — R6889 Other general symptoms and signs: Secondary | ICD-10-CM

## 2019-01-19 MED ORDER — BENZONATATE 100 MG PO CAPS: 100.0000 mg | ORAL_CAPSULE | Freq: Three times a day (TID) | ORAL | 0 refills | Status: DC | PRN

## 2019-01-19 MED ORDER — OSELTAMIVIR PHOSPHATE 75 MG PO CAPS: 75.0000 mg | ORAL_CAPSULE | Freq: Two times a day (BID) | ORAL | 0 refills | Status: DC

## 2019-01-19 NOTE — ED Triage Notes (Signed)
Runny nose, stuffy nose, cough and congestion and chest soreness with inspiration.  Onset Monday of symptoms

## 2019-01-19 NOTE — ED Provider Notes (Signed)
[facility]    CSN: [id number] Arrival date & time: 01/19/19  1414     History   Chief Complaint Chief Complaint  Patient presents with  . URI    HPI Sheena Petty is a 38 y.o. female.   Runny nose, stuffy nose, cough and congestion and chest soreness with inspiration.  Onset Monday of symptoms. Her brother tested positive for the flu last Saturday, and her symptoms began two days later.  She is not short of breath, no vomiting, no sore throat or ear ache.  She works in Biomedical scientist.       History reviewed. No pertinent past medical history.  There are no active problems to display for this patient.   Past Surgical History:  Procedure Laterality Date  . CESAREAN SECTION    . TUBAL LIGATION      OB History   No obstetric history on file.      Home Medications    Prior to Admission medications   Medication Sig Start Date End Date Taking? Authorizing Provider  benzonatate (TESSALON) 100 MG capsule Take 1-2 capsules (100-200 mg total) by mouth 3 (three) times daily as needed for cough. 01/19/19   Elvina Sidle, MD  oseltamivir (TAMIFLU) 75 MG capsule Take 1 capsule (75 mg total) by mouth every 12 (twelve) hours. 01/19/19   Elvina Sidle, MD    Family History History reviewed. No pertinent family history.  Social History Social History   Tobacco Use  . Smoking status: Current Some Day Smoker  . Smokeless tobacco: Never Used  Substance Use Topics  . Alcohol use: Yes  . Drug use: No     Allergies   Patient has no known allergies.   Review of Systems Review of Systems   Physical Exam Triage Vital Signs ED Triage Vitals [01/19/19 1503]  Enc Vitals Group     BP      Pulse      Resp      Temp      Temp src      SpO2      Weight      Height      Head Circumference      Peak Flow      Pain Score 7     Pain Loc      Pain Edu?      Excl. in GC?    No data found.  Updated Vital Signs BP 131/85 (BP Location: Left  Arm)   Pulse 80   Temp 98.5 F (36.9 C) (Oral)   Resp 18   LMP 12/18/2018   SpO2 100%    Physical Exam Vitals signs and nursing note reviewed.  Constitutional:      Appearance: Normal appearance. She is obese.  HENT:     Head: Normocephalic.     Right Ear: Tympanic membrane and external ear normal.     Left Ear: Tympanic membrane and external ear normal.     Nose: Nose normal.     Mouth/Throat:     Mouth: Mucous membranes are moist.     Pharynx: Oropharynx is clear.  Eyes:     Conjunctiva/sclera: Conjunctivae normal.     Pupils: Pupils are equal, round, and reactive to light.  Neck:     Musculoskeletal: Normal range of motion and neck supple.  Cardiovascular:     Rate and Rhythm: Normal rate and regular rhythm.     Heart sounds: Normal heart sounds.  Pulmonary:  Effort: Pulmonary effort is normal.     Breath sounds: Normal breath sounds.  Musculoskeletal: Normal range of motion.  Skin:    General: Skin is warm and dry.  Neurological:     General: No focal deficit present.     Mental Status: She is alert and oriented to person, place, and time.     Gait: Gait normal.  Psychiatric:        Mood and Affect: Mood normal.        Thought Content: Thought content normal.      UC Treatments / Results  Labs (all labs ordered are listed, but only abnormal results are displayed) Labs Reviewed - No data to display  EKG None  Radiology No results found.  Procedures Procedures (including critical care time)  Medications Ordered in UC Medications - No data to display  Initial Impression / Assessment and Plan / UC Course  I have reviewed the triage vital signs and the nursing notes.  Pertinent labs & imaging results that were available during my care of the patient were reviewed by me and considered in my medical decision making (see chart for details).    Final Clinical Impressions(s) / UC Diagnoses   Final diagnoses:  Flu-like symptoms   Discharge  Instructions   None    ED Prescriptions    Medication Sig Dispense Auth. Provider   oseltamivir (TAMIFLU) 75 MG capsule Take 1 capsule (75 mg total) by mouth every 12 (twelve) hours. 10 capsule Elvina Sidle, MD   benzonatate (TESSALON) 100 MG capsule Take 1-2 capsules (100-200 mg total) by mouth 3 (three) times daily as needed for cough. 40 capsule Elvina Sidle, MD     Controlled Substance Prescriptions  Controlled Substance Registry consulted? Not Applicable   Elvina Sidle, MD 01/19/19 1515

## 2019-03-18 ENCOUNTER — Encounter (HOSPITAL_COMMUNITY): Payer: Self-pay

## 2019-03-18 ENCOUNTER — Emergency Department
Admission: EM | Admit: 2019-03-18 | Discharge: 2019-03-18 | Disposition: A | Discharge status: Home / Self Care | Payer: Medicaid Other

## 2019-03-18 ENCOUNTER — Other Ambulatory Visit: Payer: Self-pay

## 2019-03-18 ENCOUNTER — Ambulatory Visit (HOSPITAL_COMMUNITY)
Admission: EM | Admit: 2019-03-18 | Discharge: 2019-03-18 | Disposition: A | Discharge status: Home / Self Care | Payer: Medicaid Other | Attending: Family Medicine | Admitting: Family Medicine

## 2019-03-18 DIAGNOSIS — R1032 Left lower quadrant pain: Secondary | ICD-10-CM

## 2019-03-18 LAB — POCT URINALYSIS DIP (DEVICE)
Bilirubin Urine: NEGATIVE
Glucose, UA: NEGATIVE mg/dL
Ketones, ur: NEGATIVE mg/dL
Leukocytes,Ua: NEGATIVE
Nitrite: NEGATIVE
Protein, ur: NEGATIVE mg/dL
Specific Gravity, Urine: 1.025 (ref 1.005–1.030)
Urobilinogen, UA: 0.2 mg/dL (ref 0.0–1.0)
pH: 6 (ref 5.0–8.0)

## 2019-03-18 LAB — POCT PREGNANCY, URINE: Preg Test, Ur: NEGATIVE

## 2019-03-18 MED ORDER — NAPROXEN 500 MG PO TABS: 500.0000 mg | ORAL_TABLET | Freq: Two times a day (BID) | ORAL | 0 refills | Status: DC

## 2019-03-18 NOTE — ED Notes (Signed)
Patient called x 2.  No response.

## 2019-03-18 NOTE — Discharge Instructions (Signed)
Naproxen twice a day, take with food.  Heat pack application.  Your urine does not have any indications of infection.  Kidney stone, gas/stool, or ovarian cyst are all possible causes of this location of pain.  I think it is appropriate to follow up with your gynecologist given your history.  However, if develop worsening of symptoms prior to that visit- increased pain, fevers, urinary symptoms or blood in urine, nausea or vomiting- or otherwise worsening, please go to the ER.

## 2019-03-18 NOTE — ED Triage Notes (Signed)
Pt states hx of ovarian cysts, c/o pain to lt side. States has an appt with her OBGYN on Tuesday but needs something for pain until.

## 2019-03-19 NOTE — ED Provider Notes (Signed)
[facility]    CSN: [id number] Arrival date & time: 03/18/19  1921     History   Chief Complaint Chief Complaint  Patient presents with  . Flank Pain    HPI Sheena Petty is a 38 y.o. female.   Sheena Petty presents with LLQ pain which started a few days ago and is persistent. Worse last night. Started a few days ago with the onset of her period, 5/11. Mild nausea and headache. Headache has improved. Pain 8/10 in severity. Has taken motrin previously which has helped some, hasn't taken tonight. No fevers. No urinary symptoms. No vaginal complaints. No diarrhea or constipation. States has had a ruptured ovarian cyst in the past and this feels exactly the same. She has an appointment with gynecology 5/19. States she feels she could use prescription strength NSAID to better control her pain before her appointment. She is not on any birth control. Has had a tubal ligation. Hx of c-section, no other abdominal surgeries.     ROS per HPI, negative if not otherwise mentioned.      History reviewed. No pertinent past medical history.  There are no active problems to display for this patient.   Past Surgical History:  Procedure Laterality Date  . CESAREAN SECTION    . TUBAL LIGATION      OB History   No obstetric history on file.      Home Medications    Prior to Admission medications   Medication Sig Start Date End Date Taking? Authorizing Provider  naproxen (NAPROSYN) 500 MG tablet Take 1 tablet (500 mg total) by mouth 2 (two) times daily. 03/18/19   Georgetta Haber, NP    Family History No family history on file.  Social History Social History   Tobacco Use  . Smoking status: Current Some Day Smoker  . Smokeless tobacco: Never Used  Substance Use Topics  . Alcohol use: Yes  . Drug use: No     Allergies   Patient has no known allergies.   Review of Systems Review of Systems   Physical Exam Triage Vital Signs ED Triage Vitals   Enc Vitals Group     BP 03/18/19 1934 120/88     Pulse Rate 03/18/19 1934 (!) 109     Resp 03/18/19 1934 18     Temp 03/18/19 1934 98.1 F (36.7 C)     Temp Source 03/18/19 1934 Oral     SpO2 03/18/19 1934 100 %     Weight --      Height --      Head Circumference --      Peak Flow --      Pain Score 03/18/19 1935 8     Pain Loc --      Pain Edu? --      Excl. in GC? --    No data found.  Updated Vital Signs BP 120/88 (BP Location: Right Arm)   Pulse (!) 109   Temp 98.1 F (36.7 C) (Oral)   Resp 18   LMP 03/14/2019   SpO2 100%    Physical Exam Constitutional:      General: She is not in acute distress.    Appearance: She is well-developed.  Cardiovascular:     Rate and Rhythm: Regular rhythm. Tachycardia present.     Heart sounds: Normal heart sounds.  Pulmonary:     Effort: Pulmonary effort is normal.     Breath sounds: Normal breath sounds.  Abdominal:  Tenderness: There is abdominal tenderness in the left lower quadrant.  Genitourinary:    General: Normal vulva.     Vagina: Normal.     Cervix: No cervical motion tenderness, discharge, friability or cervical bleeding.     Uterus: Normal.      Adnexa:        Left: Tenderness present. No fullness.       Comments: LLQ abdominal as well as adnexal tenderness on palpation  Skin:    General: Skin is warm and dry.  Neurological:     Mental Status: She is alert and oriented to person, place, and time.      UC Treatments / Results  Labs (all labs ordered are listed, but only abnormal results are displayed) Labs Reviewed  POCT URINALYSIS DIP (DEVICE) - Abnormal; Notable for the following components:      Result Value   Hgb urine dipstick SMALL (*)    All other components within normal limits  POC URINE PREG, ED  POCT PREGNANCY, URINE  CERVICOVAGINAL ANCILLARY ONLY    EKG None  Radiology No results found.  Procedures Procedures (including critical care time)  Medications Ordered in UC  Medications - No data to display  Initial Impression / Assessment and Plan / UC Course  I have reviewed the triage vital signs and the nursing notes.  Pertinent labs & imaging results that were available during my care of the patient were reviewed by me and considered in my medical decision making (see chart for details).     hgb in urine, recent menstruation vs kidney stone? Diverticulitis vs nephrolithiasis vs ovarian cyst considered. Slight tachycardia, patient is in discomfort. Afebrile. No guarding or rebound tenderness. Do feel its reasonable to trial naproxen 500mg  tabs with strict ER precautions. Follow up with gyn as scheduled.declines toradol in clinic tonight. Patient verbalized understanding and agreeable to plan.  Ambulatory out of clinic without difficulty.    Final Clinical Impressions(s) / UC Diagnoses   Final diagnoses:  Abdominal pain, left lower quadrant     Discharge Instructions     Naproxen twice a day, take with food.  Heat pack application.  Your urine does not have any indications of infection.  Kidney stone, gas/stool, or ovarian cyst are all possible causes of this location of pain.  I think it is appropriate to follow up with your gynecologist given your history.  However, if develop worsening of symptoms prior to that visit- increased pain, fevers, urinary symptoms or blood in urine, nausea or vomiting- or otherwise worsening, please go to the ER.    ED Prescriptions    Medication Sig Dispense Auth. Provider   naproxen (NAPROSYN) 500 MG tablet Take 1 tablet (500 mg total) by mouth 2 (two) times daily. 30 tablet Georgetta HaberBurky, Maggi Hershkowitz B, NP     Controlled Substance Prescriptions [place] Controlled Substance Registry consulted? Not Applicable   Georgetta HaberBurky, Kalid Ghan B, NP 03/19/19 [phone]

## 2019-03-22 ENCOUNTER — Telehealth (HOSPITAL_COMMUNITY): Payer: Self-pay | Admitting: Emergency Medicine

## 2019-03-22 LAB — CERVICOVAGINAL ANCILLARY ONLY
Bacterial vaginitis: POSITIVE — AB
Candida vaginitis: NEGATIVE
Chlamydia: NEGATIVE
Neisseria Gonorrhea: NEGATIVE
Trichomonas: NEGATIVE

## 2019-03-22 MED ORDER — METRONIDAZOLE 500 MG PO TABS: 500.0000 mg | ORAL_TABLET | Freq: Two times a day (BID) | ORAL | 0 refills | Status: AC

## 2019-03-22 NOTE — Telephone Encounter (Signed)
Bacterial vaginosis is positive. This was not treated at the urgent care visit.  Flagyl 500 mg BID x 7 days #14 no refills sent to patients pharmacy of choice.    Patient contacted and made aware of all results, all questions answered.  

## 2019-08-05 ENCOUNTER — Ambulatory Visit (HOSPITAL_COMMUNITY)
Admission: EM | Admit: 2019-08-05 | Discharge: 2019-08-05 | Disposition: A | Discharge status: Home / Self Care | Payer: Self-pay | Attending: Family Medicine | Admitting: Family Medicine

## 2019-08-05 ENCOUNTER — Encounter (HOSPITAL_COMMUNITY): Payer: Self-pay

## 2019-08-05 ENCOUNTER — Other Ambulatory Visit: Payer: Self-pay

## 2019-08-05 DIAGNOSIS — M25531 Pain in right wrist: Secondary | ICD-10-CM

## 2019-08-05 MED ORDER — MELOXICAM 7.5 MG PO TABS: 7.5000 mg | ORAL_TABLET | Freq: Every day | ORAL | 0 refills | Status: DC

## 2019-08-05 NOTE — ED Triage Notes (Signed)
Patient presents to Urgent Care with complaints of right wrist pain since 2 days ago. Patient reports she does repetitive movement at work and is worried she has carpal tunnel.

## 2019-08-05 NOTE — ED Provider Notes (Signed)
[facility]    CSN: [id number] Arrival date & time: 08/05/19  1544      History   Chief Complaint Chief Complaint  Patient presents with  . Wrist Pain    HPI Sheena Petty is a 38 y.o. female.   Sheena Petty presents with complaints of right wrist pain which started a few days ago. Feels "deep" with a tingling sensation, sometimes radiates to proximal thumb. Hears and feels a popping sensation with flexion at times now as well. No numbness. When she wakes it feels stiff and achy. No known injury. No redness. Sometimes she feels it appears swollen. She is right handed. She works loading and unloading boxes. Has been off of work for the past few days, however. Has been taking tylenol and motrin which have not helped. Denies any previous issue with her wrist or other orthopedic history. Without contributing medical history.     ROS per HPI, negative if not otherwise mentioned.      History reviewed. No pertinent past medical history.  There are no active problems to display for this patient.   Past Surgical History:  Procedure Laterality Date  . Sheena Petty    . Sheena Petty      OB History   No obstetric history on file.      Home Medications    Prior to Admission medications   Medication Sig Start Date End Date Taking? Authorizing Provider  meloxicam (MOBIC) 7.5 MG tablet Take 1 tablet (7.5 mg total) by mouth daily. 08/05/19   Georgetta Haber, NP    Family History Family History  Problem Relation Age of Onset  . Asthma Mother   . Hypertension Mother   . Healthy Father     Social History Social History   Tobacco Use  . Smoking status: Current Some Day Smoker    Packs/day: 0.30    Types: Cigarettes  . Smokeless tobacco: Never Used  . Tobacco comment: 6 per day  Substance Use Topics  . Alcohol use: Yes  . Drug use: No     Allergies   Patient has no known allergies.   Review of Systems Review of Systems   Physical  Exam Triage Vital Signs ED Triage Vitals  Enc Vitals Group     BP 08/05/19 1608 138/86     Pulse Rate 08/05/19 1608 92     Resp 08/05/19 1608 16     Temp 08/05/19 1608 98.3 F (36.8 C)     Temp Source 08/05/19 1608 Oral     SpO2 08/05/19 1608 98 %     Weight --      Height --      Head Circumference --      Peak Flow --      Pain Score 08/05/19 1606 8     Pain Loc --      Pain Edu? --      Excl. in GC? --    No data found.  Updated Vital Signs BP 138/86 (BP Location: Left Arm)   Pulse 92   Temp 98.3 F (36.8 C) (Oral)   Resp 16   SpO2 98%    Physical Exam Constitutional:      General: She is not in acute distress.    Appearance: She is well-developed.  Cardiovascular:     Rate and Rhythm: Normal rate.  Pulmonary:     Effort: Pulmonary effort is normal.  Musculoskeletal:     Right wrist: She exhibits  tenderness and crepitus. She exhibits normal range of motion, no bony tenderness, no swelling, no effusion, no deformity and no laceration.     Right hand: Normal.     Comments: Tenderness at midline wrist without specific bony tenderness; pain with flexion, no pain with extension; crepitus with rotation noted; negative phalen and tinel sign; strong radial pulse; cap refill < 2 seconds; full ROM of fingers   Skin:    General: Skin is warm and dry.  Neurological:     Mental Status: She is alert and oriented to person, place, and time.      UC Treatments / Results  Labs (all labs ordered are listed, but only abnormal results are displayed) Labs Reviewed - No data to display  EKG   Radiology No results found.  Procedures Procedures (including critical care time)  Medications Ordered in UC Medications - No data to display  Initial Impression / Assessment and Plan / UC Course  I have reviewed the triage vital signs and the nursing notes.  Pertinent labs & imaging results that were available during my care of the patient were reviewed by me and considered  in my medical decision making (see chart for details).     No obviously carpel tunnel, which is what patient is worried about. Seems more likely general arthritic pain. No injury, imaging deferred at this time. Will brace, ice, nsaids for pain. Follow up with sports medicine and/or ortho for further eval. Patient verbalized understanding and agreeable to plan.   Final Clinical Impressions(s) / UC Diagnoses   Final diagnoses:  Right wrist pain     Discharge Instructions     Use of brace, especially while active.  Meloxicam, take with food. Don't take additional motrin.  Please follow up sports medicine and/or ortho for persistent symptoms as you may need further treatment.    ED Prescriptions    Medication Sig Dispense Auth. Provider   meloxicam (MOBIC) 7.5 MG tablet Take 1 tablet (7.5 mg total) by mouth daily. 20 tablet Zigmund Gottron, NP     PDMP not reviewed this encounter.   Zigmund Gottron, NP 08/05/19 [phone]

## 2019-08-05 NOTE — Discharge Instructions (Signed)
Use of brace, especially while active.  Meloxicam, take with food. Don't take additional motrin.  Please follow up sports medicine and/or ortho for persistent symptoms as you may need further treatment.

## 2019-08-12 ENCOUNTER — Ambulatory Visit: Payer: Medicaid Other | Admitting: Sports Medicine

## 2019-08-17 ENCOUNTER — Other Ambulatory Visit: Payer: Self-pay

## 2019-08-17 ENCOUNTER — Ambulatory Visit (INDEPENDENT_AMBULATORY_CARE_PROVIDER_SITE_OTHER): Payer: Self-pay | Admitting: Sports Medicine

## 2019-08-17 ENCOUNTER — Encounter: Payer: Self-pay | Admitting: Sports Medicine

## 2019-08-17 VITALS — BP 116/88 | Ht 62.0 in | Wt 175.0 lb

## 2019-08-17 DIAGNOSIS — M778 Other enthesopathies, not elsewhere classified: Secondary | ICD-10-CM

## 2019-08-17 MED ORDER — MELOXICAM 15 MG PO TABS: 15.0000 mg | ORAL_TABLET | Freq: Every day | ORAL | 0 refills | Status: DC | PRN

## 2019-08-17 NOTE — Patient Instructions (Signed)
Your wrist pain is caused by tendinitis of your right extensor carpi ulnaris - You should continue to wear the wrist brace for the next 2 weeks - I have provided you with a work note for light duty for the next 2 weeks - We will increase the dose of your meloxicam to 15 mg daily - You may take Tylenol in addition to the meloxicam but not take other anti-inflammatories  We will have you follow-up in 2 weeks.  At that time if your pain is not improved we will consider sending you to occupational therapy versus getting additional imaging

## 2019-08-17 NOTE — Progress Notes (Addendum)
PCP: [facility]  Subjective:   HPI: Patient is a 38 y.o. female here for evaluation of right wrist pain.  Pain is been present for the last 2 weeks.  She denies any injury or trauma.  The pain is located on the ulnar aspect of her wrist.  The pain does not radiate.  She has no bruising or swelling, she denies any numbness or tingling.  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  This seems to aggravate her pain.  She was seen at urgent care who gave her a wrist brace which she notes has helped improve her pain however she still continues to aggravated at work.  She was also given a prescription for meloxicam 7.5 mg daily which improves her pain slightly but she still notes discomfort.  She denies any previous injury or trauma to her wrist.   Review of Systems: See HPI above.  History reviewed. No pertinent past medical history.  Current Outpatient Medications on File Prior to Visit  Medication Sig Dispense Refill  . meloxicam (MOBIC) 7.5 MG tablet Take 1 tablet (7.5 mg total) by mouth daily. 20 tablet 0   No current facility-administered medications on file prior to visit.     Past Surgical History:  Procedure Laterality Date  . CESAREAN SECTION    . TUBAL LIGATION      No Known Allergies  Social History   Socioeconomic History  . Marital status: Single    Spouse name: Not on file  . Number of children: Not on file  . Years of education: Not on file  . Highest education level: Not on file  Occupational History  . Not on file  Social Needs  . Financial resource strain: Not on file  . Food insecurity    Worry: Not on file    Inability: Not on file  . Transportation needs    Medical: Not on file    Non-medical: Not on file  Tobacco Use  . Smoking status: Current Some Day Smoker    Packs/day: 0.30    Types: Cigarettes  . Smokeless tobacco: Never Used  . Tobacco comment: 6 per day  Substance  and Sexual Activity  . Alcohol use: Yes  . Drug use: No  . Sexual activity: Never    Birth control/protection: Surgical  Lifestyle  . Physical activity    Days per week: Not on file    Minutes per session: Not on file  . Stress: Not on file  Relationships  . Social Herbalist on phone: Not on file    Gets together: Not on file    Attends religious service: Not on file    Active member of club or organization: Not on file    Attends meetings of clubs or organizations: Not on file    Relationship status: Not on file  . Intimate partner violence    Fear of current or ex partner: Not on file    Emotionally abused: Not on file    Physically abused: Not on file    Forced sexual activity: Not on file  Other Topics Concern  . Not on file  Social History Narrative  . Not on file    Family History  Problem Relation Age of Onset  . Asthma Mother   . Hypertension Mother   . Healthy Father         Objective:  Physical Exam: BP 116/88  Ht 5\' 2"  (1.575 m)   Wt 175 lb (79.4 kg)   BMI 32.01 kg/m  Gen: NAD, comfortable in exam room Lungs: Breathing comfortably on room air Hand/wrist Exam Right -Inspection: No deformity, no discoloration -Palpation: Tenderness palpation over the TFCC and extensor carpi ulnaris -ROM: Normal range of motion with flexion, extension, radial deviation, ulnar deviation, pronation, supination.  Patient had pain with ulnar deviation of the wrist. -Strength: Flexion: 5/5; Extension: 5/5; Radial Deviation: 5/5; Ulnar Deviation: 5/5.  Patient had pain with resisted ulnar deviation. -Special Tests: Tinels: Negative; Phalens: Negative; Finkelstein's: Negative -Limb neurovascularly intact  Contralateral Hand/wrist -Inspection: No deformity, no discoloration -Palpation: distal radius: non-tender; Distal ulna: non-tender; scaphoid: non-tender -ROM: Normal range of motion with flexion, extension, radial deviation, ulnar deviation, pronation,  supination -Strength: Flexion: 5/5; Extension: 5/5; Radial Deviation: 5/5; Ulnar Deviation: 5/5 -Limb neurovascularly intact   Limited diagnostic ultrasound of the right wrist Findings: - Normal appearance of the TFCC.  No tears noted - Small amount of fluid noted around the extensor carpi ulnaris.  Extensor carpi ulnaris did not sublux.  The tendon itself was intact.  It was viewed in both long and short axis Impression: - Ultrasound findings consistent with tendinitis of the extensor carpi ulnaris   Assessment & Plan:  Patient is a 38 y.o. female here for evaluation of right wrist pain  1.  Right wrist extensor carpi ulnaris tendinitis -Patient will continue to wear the wrist brace for the next 2 weeks - Patient was given a work note for light duty for the next 2 weeks -Meloxicam dose is increased to 15 mg daily -Patient may supplement with Tylenol but was instructed not to take other anti-inflammatories  Patient to follow-up in 2 weeks.  If she is not having improvement at that time we will consider sending patient to occupational therapy versus getting additional imaging with MRI.  Addendum:  Patient seen in the office by fellow.  His history, exam, plan of care were precepted with me.  20 MD Norton Blizzard

## 2019-08-31 ENCOUNTER — Emergency Department (HOSPITAL_COMMUNITY)
Admission: EM | Admit: 2019-08-31 | Discharge: 2019-08-31 | Disposition: A | Discharge status: Home / Self Care | Payer: Self-pay | Attending: Emergency Medicine | Admitting: Emergency Medicine

## 2019-08-31 ENCOUNTER — Encounter (HOSPITAL_COMMUNITY): Payer: Self-pay

## 2019-08-31 ENCOUNTER — Ambulatory Visit: Payer: Self-pay | Admitting: Sports Medicine

## 2019-08-31 ENCOUNTER — Emergency Department (HOSPITAL_COMMUNITY): Payer: Self-pay

## 2019-08-31 ENCOUNTER — Other Ambulatory Visit: Payer: Self-pay

## 2019-08-31 DIAGNOSIS — R1084 Generalized abdominal pain: Secondary | ICD-10-CM

## 2019-08-31 DIAGNOSIS — K5792 Diverticulitis of intestine, part unspecified, without perforation or abscess without bleeding: Secondary | ICD-10-CM | POA: Insufficient documentation

## 2019-08-31 DIAGNOSIS — F17210 Nicotine dependence, cigarettes, uncomplicated: Secondary | ICD-10-CM | POA: Insufficient documentation

## 2019-08-31 DIAGNOSIS — M25531 Pain in right wrist: Secondary | ICD-10-CM | POA: Insufficient documentation

## 2019-08-31 DIAGNOSIS — K921 Melena: Secondary | ICD-10-CM | POA: Insufficient documentation

## 2019-08-31 LAB — URINALYSIS, ROUTINE W REFLEX MICROSCOPIC
Glucose, UA: NEGATIVE mg/dL
Ketones, ur: 5 mg/dL — AB
Leukocytes,Ua: NEGATIVE
Nitrite: NEGATIVE
Protein, ur: 30 mg/dL — AB
Specific Gravity, Urine: 1.034 — ABNORMAL HIGH (ref 1.005–1.030)
pH: 5 (ref 5.0–8.0)

## 2019-08-31 LAB — COMPREHENSIVE METABOLIC PANEL
ALT: 35 U/L (ref 0–44)
AST: 30 U/L (ref 15–41)
Albumin: 4.6 g/dL (ref 3.5–5.0)
Alkaline Phosphatase: 73 U/L (ref 38–126)
Anion gap: 9 (ref 5–15)
BUN: 13 mg/dL (ref 6–20)
CO2: 25 mmol/L (ref 22–32)
Calcium: 8.9 mg/dL (ref 8.9–10.3)
Chloride: 101 mmol/L (ref 98–111)
Creatinine, Ser: 0.89 mg/dL (ref 0.44–1.00)
GFR calc Af Amer: >60 mL/min (ref >60)
GFR calc non Af Amer: >60 mL/min (ref >60)
Glucose, Bld: 110 mg/dL — ABNORMAL HIGH (ref 70–99)
Potassium: 3.9 mmol/L (ref 3.5–5.1)
Sodium: 135 mmol/L (ref 135–145)
Total Bilirubin: 1 mg/dL (ref 0.3–1.2)
Total Protein: 7.6 g/dL (ref 6.5–8.1)

## 2019-08-31 LAB — CBC
HCT: 41.4 % (ref 36.0–46.0)
Hemoglobin: 13.1 g/dL (ref 12.0–15.0)
MCH: 28.7 pg (ref 26.0–34.0)
MCHC: 31.6 g/dL (ref 30.0–36.0)
MCV: 90.8 fL (ref 80.0–100.0)
Platelets: 280 10*3/uL (ref 150–400)
RBC: 4.56 MIL/uL (ref 3.87–5.11)
RDW: 14.5 % (ref 11.5–15.5)
WBC: 18.8 10*3/uL — ABNORMAL HIGH (ref 4.0–10.5)
nRBC: 0 % (ref 0.0–0.2)

## 2019-08-31 LAB — LACTIC ACID, PLASMA: Lactic Acid, Venous: 1.4 mmol/L (ref 0.5–1.9)

## 2019-08-31 LAB — I-STAT BETA HCG BLOOD, ED (MC, WL, AP ONLY): I-stat hCG, quantitative: <5 m[IU]/mL (ref <5)

## 2019-08-31 LAB — OCCULT BLOOD X 1 CARD TO LAB, STOOL: Fecal Occult Bld: NEGATIVE

## 2019-08-31 LAB — LIPASE, BLOOD: Lipase: 23 U/L (ref 11–51)

## 2019-08-31 MED ORDER — IOHEXOL 300 MG/ML  SOLN
100.0000 mL | Freq: Once | INTRAMUSCULAR | Status: AC | PRN
  Administered 2019-08-31: 15:00:00 100 mL via INTRAVENOUS

## 2019-08-31 MED ORDER — SODIUM CHLORIDE 0.9% FLUSH: 3.0000 mL | Freq: Once | INTRAVENOUS | Status: DC

## 2019-08-31 MED ORDER — METRONIDAZOLE 500 MG PO TABS: 500.0000 mg | ORAL_TABLET | Freq: Three times a day (TID) | ORAL | 0 refills | Status: AC

## 2019-08-31 MED ORDER — SODIUM CHLORIDE 0.9 % IV BOLUS
1000.0000 mL | Freq: Once | INTRAVENOUS | Status: AC
  Administered 2019-08-31: 15:00:00 1000 mL via INTRAVENOUS

## 2019-08-31 MED ORDER — SODIUM CHLORIDE (PF) 0.9 % IJ SOLN
INTRAMUSCULAR | Status: AC
  Filled 2019-08-31: qty 50

## 2019-08-31 MED ORDER — CIPROFLOXACIN HCL 500 MG PO TABS: 500.0000 mg | ORAL_TABLET | Freq: Two times a day (BID) | ORAL | 0 refills | Status: AC

## 2019-08-31 MED ORDER — MORPHINE SULFATE (PF) 4 MG/ML IV SOLN
4.0000 mg | Freq: Once | INTRAVENOUS | Status: AC
  Administered 2019-08-31: 4 mg via INTRAVENOUS
  Filled 2019-08-31: qty 1

## 2019-08-31 MED ORDER — PIPERACILLIN-TAZOBACTAM 3.375 G IVPB 30 MIN
3.3750 g | Freq: Once | INTRAVENOUS | Status: AC
  Administered 2019-08-31: 3.375 g via INTRAVENOUS
  Filled 2019-08-31: qty 50

## 2019-08-31 NOTE — ED Triage Notes (Signed)
Pt states that she had blood in her stool several days ago. Pt states that she has been having lower abd pain since, that is now radiating to the umbilical area. Pt denies N/V/D. Pt also has pain in right wrist. Pt was seen and UC and sports med. Pt was supposed to have follow up visit with sports med today. [place] was done with sports medicine to rule out carpal tunnel.

## 2019-08-31 NOTE — ED Notes (Signed)
Patient transported to CT 

## 2019-08-31 NOTE — Discharge Instructions (Signed)
Your CT scan showed findings of diverticulitis which is an infection in outpouchings in your colon.  Please take antibiotics as prescribed - DO NOT DRINK ALCOHOL WHILE ON THIS MEDICATION.  Increase your fluid intake as well. It is important to stay hydrated.  Please follow up with your PCP for further evaluation.  Return to the ED IMMEDIATELY for any worsening symptoms including worsening pain after 48 hours of antibiotics, inability to keep fluids down, fever > 100.4, excessive vomiting or diarrhea

## 2019-08-31 NOTE — ED Provider Notes (Signed)
[facility]   CSN: [id number] Arrival date & time: 08/31/19  1236     History   Chief Complaint Chief Complaint  Patient presents with   Abdominal Pain   Wrist Pain    HPI Sheena Petty is a 38 y.o. female who presents to the ED today complaining of gradual onset, constant, diffuse abdominal pain that started today.  She reports that for the last couple of days she has noticed blood in her stool.  She states that few days ago she noticed this while having a bowel movement.  It has been progressively improving since then but she continues to have bright red blood in the toilet bowl as well as on tissue paper.  She reports that she started having abdominal pain today prompted her to come to the ED.  No suspicious food intake.  She states that her and her daughter eat the same thing and her daughter is not having any symptoms.  No recent antibiotic use.  No recent foreign travel.  She took Advil earlier today without relief.  Denies fever, chills, nausea, vomiting, urinary complaints, diarrhea, melena, any other associated symptoms.   She also complaining of pain to her right wrist for a few weeks.  She reports that she has been seen at urgent care and sports medicine and she was supposed to have a follow-up visit with sports medicine today.  She states that she has had an ultrasound done with sports medicine to rule out carpal tunnel syndrome.  She states they could not see much on the ultrasound and they wanted to see her back today for what she thinks was an x-ray.  Chart review it does appear that they were considering an MRI.      History reviewed. No pertinent past medical history.  There are no active problems to display for this patient.   Past Surgical History:  Procedure Laterality Date   CESAREAN SECTION     TUBAL LIGATION       OB History   No obstetric history on file.      Home Medications    Prior to  Admission medications   Medication Sig Start Date End Date Taking? Authorizing Provider  meloxicam (MOBIC) 15 MG tablet Take 1 tablet (15 mg total) by mouth daily as needed. Take with food. 08/17/19  Yes Christena DeemAlexander, Kyle, MD  ciprofloxacin (CIPRO) 500 MG tablet Take 1 tablet (500 mg total) by mouth 2 (two) times daily for 10 days. 08/31/19 09/10/19  Hyman HopesVenter, Gabriel Paulding, PA-C  metroNIDAZOLE (FLAGYL) 500 MG tablet Take 1 tablet (500 mg total) by mouth 3 (three) times daily for 10 days. 08/31/19 09/10/19  Tanda RockersVenter, Karrisa Didio, PA-C    Family History Family History  Problem Relation Age of Onset   Asthma Mother    Hypertension Mother    Healthy Father     Social History Social History   Tobacco Use   Smoking status: Current Some Day Smoker    Packs/day: 0.30    Types: Cigarettes   Smokeless tobacco: Never Used   Tobacco comment: 6 per day  Substance Use Topics   Alcohol use: Yes   Drug use: No     Allergies   Patient has no known allergies.   Review of Systems Review of Systems  Constitutional: Negative for chills and fever.  HENT: Negative for congestion.   Eyes: Negative for visual disturbance.  Respiratory: Negative for cough and shortness of breath.   Cardiovascular: Negative  for chest pain.  Gastrointestinal: Positive for abdominal pain and blood in stool. Negative for constipation, diarrhea, nausea and vomiting.  Genitourinary: Negative for difficulty urinating, flank pain and frequency.  Musculoskeletal: Positive for arthralgias. Negative for myalgias.  Skin: Negative for rash.  Neurological: Negative for headaches.     Physical Exam Updated Vital Signs BP (!) 132/98    Pulse 94    Temp 99.3 F (37.4 C) (Oral)    Resp 16    Wt 84.9 kg    LMP 08/18/2019    SpO2 100%    BMI 34.22 kg/m   Physical Exam Vitals signs and nursing note reviewed.  Constitutional:      Appearance: She is not ill-appearing or diaphoretic.  HENT:     Head: Normocephalic and atraumatic.    Eyes:     Conjunctiva/sclera: Conjunctivae normal.  Neck:     Musculoskeletal: Neck supple.  Cardiovascular:     Rate and Rhythm: Normal rate and regular rhythm.     Heart sounds: Normal heart sounds.  Pulmonary:     Effort: Pulmonary effort is normal.     Breath sounds: Normal breath sounds. No wheezing, rhonchi or rales.  Abdominal:     General: Abdomen is flat. Bowel sounds are normal.     Palpations: Abdomen is soft.     Tenderness: There is generalized abdominal tenderness. There is no right CVA tenderness, left CVA tenderness, guarding or rebound.  Skin:    General: Skin is warm and dry.  Neurological:     Mental Status: She is alert.      ED Treatments / Results  Labs (all labs ordered are listed, but only abnormal results are displayed) Labs Reviewed  COMPREHENSIVE METABOLIC PANEL - Abnormal; Notable for the following components:      Result Value   Glucose, Bld 110 (*)    All other components within normal limits  CBC - Abnormal; Notable for the following components:   WBC 18.8 (*)    All other components within normal limits  URINALYSIS, ROUTINE W REFLEX MICROSCOPIC - Abnormal; Notable for the following components:   Color, Urine AMBER (*)    APPearance CLOUDY (*)    Specific Gravity, Urine 1.034 (*)    Hgb urine dipstick MODERATE (*)    Bilirubin Urine SMALL (*)    Ketones, ur 5 (*)    Protein, ur 30 (*)    Bacteria, UA RARE (*)    All other components within normal limits  URINE CULTURE  URINE CULTURE  LIPASE, BLOOD  LACTIC ACID, PLASMA  OCCULT BLOOD X 1 CARD TO LAB, STOOL  I-STAT BETA HCG BLOOD, ED ([facility], [facility], [facility] ONLY)    EKG None  Radiology Ct Abdomen Pelvis W Contrast  Result Date: 08/31/2019 CLINICAL DATA:  38 year old female with history of blood in the stool. Acute abdominal pain generalized for the past 2 days with blood in stool and nausea. EXAM: CT ABDOMEN AND PELVIS WITH CONTRAST TECHNIQUE: Multidetector CT imaging of the abdomen and  pelvis was performed using the standard protocol following bolus administration of intravenous contrast. CONTRAST:  160mL OMNIPAQUE IOHEXOL 300 MG/ML  SOLN COMPARISON:  No priors. FINDINGS: Lower chest: Small hiatal hernia. Hepatobiliary: No suspicious cystic or solid hepatic lesions. No intra or extrahepatic biliary ductal dilatation. Gallbladder is normal in appearance. Pancreas: No pancreatic mass. No pancreatic ductal dilatation. No pancreatic or peripancreatic fluid collections or inflammatory changes. Spleen: Unremarkable. Adrenals/Urinary Tract: 1.2 cm low-attenuation lesion in the lateral aspect of  the interpolar region of the left kidney, compatible with a simple cyst. Right kidney and bilateral adrenal glands are normal in appearance. No hydroureteronephrosis. Urinary bladder is normal in appearance. Stomach/Bowel: Normal appearance of the stomach. No pathologic dilatation of small bowel or colon. Numerous colonic diverticulae are noted, particularly in the sigmoid colon where there are extensive areas of mural thickening and surrounding inflammatory changes associated with the proximal sigmoid colon, compatible with an acute diverticulitis. No discrete diverticular abscess confidently identified at this time. No definite signs of frank perforation. Normal appendix. Vascular/Lymphatic: No significant atherosclerotic disease, aneurysm or dissection noted in the abdominal or pelvic vasculature. No lymphadenopathy noted in the abdomen or pelvis. Reproductive: Bilateral tubal ligation clips are noted. Uterus and ovaries are unremarkable in appearance. Other: No significant volume of ascites.  No pneumoperitoneum. Musculoskeletal: There are no aggressive appearing lytic or blastic lesions noted in the visualized portions of the skeleton. IMPRESSION: 1. Severe acute diverticulitis in the proximal sigmoid colon, without discrete diverticular abscess or definite signs of frank perforation at this time. 2. Small  hiatal hernia. Electronically Signed   By: Trudie Reed M.D.   On: 08/31/2019 16:14    Procedures Procedures (including critical care time)  Medications Ordered in ED Medications  sodium chloride (PF) 0.9 % injection (has no administration in time range)  sodium chloride 0.9 % bolus 1,000 mL (0 mLs Intravenous Stopped 08/31/19 1640)  morphine 4 MG/ML injection 4 mg (4 mg Intravenous Given 08/31/19 1453)  piperacillin-tazobactam (ZOSYN) IVPB 3.375 g (0 g Intravenous Stopped 08/31/19 1539)  iohexol (OMNIPAQUE) 300 MG/ML solution 100 mL (100 mLs Intravenous Contrast Given 08/31/19 1510)     Initial Impression / Assessment and Plan / ED Course  I have reviewed the triage vital signs and the nursing notes.  Pertinent labs & imaging results that were available during my care of the patient were reviewed by me and considered in my medical decision making (see chart for details).    38 year old female who presents to the ED today initially complaining of blood in her stool for the past few days as well as he is abdominal pain that started today. On exam patient does not appear overtly uncomfortable.  She states that her pain is about a 6 out of 10.  No nausea or vomiting.  Her vitals are stable today.  Patient afebrile without tachycardia or tachypnea.  She does not appear toxic today.  Screening labs were obtained in the waiting room prior to being seen - leukocytosis present at 18,000.  Given this we will start on empiric antibiotics.  Zosyn chosen for intra-abdominal infection as suspected source.  No other abnormalities on exam.  No electrolyte abnormalities.  Patient has a leukocytosis of blood in her stool will obtain CT abdomen pelvis today to rule out intra-abdominal infection although suspect colitis.  She without any suspicious food intake.  No recent foreign travel.  No recent antibiotic use to suggest C. Difficile.  Normal saline bolus as well as morphine given for comfort.     Urinalysis appears amber in color and has moderate hemoglobin with 11-20 red blood cells per high-power field.  Patient states that she was on her period 2 weeks ago is not currently bleeding.  No leuks or nitrites.  0-5 white blood cells per high-power field.  Will send for culture although low suspicion for infection.  Patient may have blood in her urine from her rectum.  Rectal exam no obvious gross blood on glove.  CT scan with findings of diverticulitis without abscess or perforation.  Upon reevaluation patient appears comfortable.  She states that she is comfortable going home with oral antibiotics.  I feel that this is appropriate at this time.  Will discharge with Cipro and Flagyl.  Patient advised to follow-up with her PCP.  Strict return precautions have been discussed with patient and all questions answered prior to discharge.  Patient is in agreement with plan at this time stable for discharge home.   This note was prepared using Dragon voice recognition software and may include unintentional dictation errors due to the inherent limitations of voice recognition software.        Final Clinical Impressions(s) / ED Diagnoses   Final diagnoses:  Diverticulitis  Generalized abdominal pain  Blood in stool    ED Discharge Orders         Ordered    ciprofloxacin (CIPRO) 500 MG tablet  2 times daily     08/31/19 1643    metroNIDAZOLE (FLAGYL) 500 MG tablet  3 times daily     08/31/19 1643           Tanda Rockers, PA-C 08/31/19 1649    Sabas Sous, MD 08/31/19 2253

## 2019-09-01 LAB — URINE CULTURE: Culture: <10000 — AB

## 2019-09-07 ENCOUNTER — Other Ambulatory Visit: Payer: Self-pay

## 2019-09-07 ENCOUNTER — Ambulatory Visit (INDEPENDENT_AMBULATORY_CARE_PROVIDER_SITE_OTHER): Payer: Self-pay | Admitting: Sports Medicine

## 2019-09-07 ENCOUNTER — Encounter: Payer: Self-pay | Admitting: Sports Medicine

## 2019-09-07 VITALS — BP 126/92 | Ht 62.0 in | Wt 180.0 lb

## 2019-09-07 DIAGNOSIS — M25531 Pain in right wrist: Secondary | ICD-10-CM

## 2019-09-07 NOTE — Progress Notes (Signed)
PCP: [facility], [facility]  Subjective:   HPI: Patient is a 38 y.o. female here for follow-up of right wrist pain.  Patient was last seen 2 weeks ago.  At that time she had pain in the ulnar aspect of her wrist.  She denies any specific injury or trauma but works at [employer] and endorses a lot of heavy lifting.  At her last visit she was placed in a wrist brace as well as given anti-inflammatories to take.  Patient notes her pain is not improved at all since her last visit.  Pain is located by the ulnar styloid and wrist.  The pain does not radiate.  She denies any numbness or tingling in her hands.  She denies any weakness.  Patient notes meloxicam is not improved her pain at all.  Patient notes pain with any movement of her wrist.   Review of Systems: See HPI above.  History reviewed. No pertinent past medical history.  Current Outpatient Medications on File Prior to Visit  Medication Sig Dispense Refill  . ciprofloxacin (CIPRO) 500 MG tablet Take 1 tablet (500 mg total) by mouth 2 (two) times daily for 10 days. 20 tablet 0  . meloxicam (MOBIC) 15 MG tablet Take 1 tablet (15 mg total) by mouth daily as needed. Take with food. 30 tablet 0  . metroNIDAZOLE (FLAGYL) 500 MG tablet Take 1 tablet (500 mg total) by mouth 3 (three) times daily for 10 days. 30 tablet 0   No current facility-administered medications on file prior to visit.     Past Surgical History:  Procedure Laterality Date  . CESAREAN SECTION    . TUBAL LIGATION      No Known Allergies  Social History   Socioeconomic History  . Marital status: Single    Spouse name: Not on file  . Number of children: Not on file  . Years of education: Not on file  . Highest education level: Not on file  Occupational History  . Not on file  Social Needs  . Financial resource strain: Not on file  . Food insecurity    Worry: Not on file    Inability: Not on file  . Transportation needs    Medical: Not on  file    Non-medical: Not on file  Tobacco Use  . Smoking status: Current Some Day Smoker    Packs/day: 0.30    Types: Cigarettes  . Smokeless tobacco: Never Used  . Tobacco comment: 6 per day  Substance and Sexual Activity  . Alcohol use: Yes  . Drug use: No  . Sexual activity: Never    Birth control/protection: Surgical  Lifestyle  . Physical activity    Days per week: Not on file    Minutes per session: Not on file  . Stress: Not on file  Relationships  . Social Musician on phone: Not on file    Gets together: Not on file    Attends religious service: Not on file    Active member of club or organization: Not on file    Attends meetings of clubs or organizations: Not on file    Relationship status: Not on file  . Intimate partner violence    Fear of current or ex partner: Not on file    Emotionally abused: Not on file    Physically abused: Not on file    Forced sexual activity: Not on file  Other Topics Concern  . Not on file  Social History Narrative  . Not on file    Family History  Problem Relation Age of Onset  . Asthma Mother   . Hypertension Mother   . Healthy Father         Objective:  Physical Exam: BP (!) 126/92   Ht 5\' 2"  (1.575 m)   Wt 180 lb (81.6 kg)   LMP 08/18/2019   BMI 32.92 kg/m  Gen: NAD, comfortable in exam room Lungs: Breathing comfortably on room air Hand/wrist Exam Right -Inspection: No deformity, no discoloration -Palpation: Tenderness to palpation over TFCC -ROM: Normal range of motion with flexion, extension, radial deviation, ulnar deviation, pronation, supination however patient had significant pain with ulnar deviation of her wrist -Strength: Flexion: 5/5; Extension: 5/5; Radial Deviation: 5/5; Ulnar Deviation: 4/5 -Limb neurovascularly intact   Limited diagnostic ultrasound of right wrist Findings:  -Frayed appearance of the TFCC -Small amount of fluid noted within the ECU, however this appears physiologic  as it is less than the contralateral side -Normal appearance of ECU in both long and short axis  Assessment & Plan:  Patient is a 38 y.o. female here for follow up of right wrist pain  1. Right wrist pain -Concern for possible injury to TFCC given history and ultrasound findings -Patient unable to afford MRI right now. Will be getting insurance after the new year -Continue bracing and NSAIDs -Patient will be referred to OT to work on wrist motion -Continue light duty work restrictions until next visit  Follow up in 4 weeks

## 2019-09-07 NOTE — Patient Instructions (Signed)
Your wrist pain may be caused by injury to the cartilage called the TFCC -We will refer you to occupational therapy to work on range of motion and strengthening exercises -Continue NSAIDs and bracing until your next visit -You may continue light duty at work until your next visit  We will see you back in 4 weeks

## 2019-10-05 ENCOUNTER — Ambulatory Visit: Payer: Medicaid Other | Admitting: Sports Medicine

## 2019-12-05 ENCOUNTER — Ambulatory Visit (HOSPITAL_COMMUNITY)
Admission: EM | Admit: 2019-12-05 | Discharge: 2019-12-05 | Disposition: A | Discharge status: Home / Self Care | Payer: 59 | Attending: Family Medicine | Admitting: Family Medicine

## 2019-12-05 ENCOUNTER — Other Ambulatory Visit: Payer: Self-pay

## 2019-12-05 ENCOUNTER — Telehealth: Payer: Self-pay

## 2019-12-05 ENCOUNTER — Encounter (HOSPITAL_COMMUNITY): Payer: Self-pay

## 2019-12-05 DIAGNOSIS — J069 Acute upper respiratory infection, unspecified: Secondary | ICD-10-CM | POA: Insufficient documentation

## 2019-12-05 DIAGNOSIS — M25531 Pain in right wrist: Secondary | ICD-10-CM

## 2019-12-05 DIAGNOSIS — Z20822 Contact with and (suspected) exposure to covid-19: Secondary | ICD-10-CM | POA: Insufficient documentation

## 2019-12-05 NOTE — ED Triage Notes (Signed)
Pt presents for covid testing after significant other tested positive for covid 2 days ago; pt states she has had nasal drainage, non productive cough, and shortness of breath.

## 2019-12-05 NOTE — Discharge Instructions (Addendum)
Go home to rest Drink plenty of fluids Take Tylenol for pain or fever You may take over-the-counter cough and cold medicines as needed You must quarantine at home until your test result is available You can check for your test result in MyChart Call for advice or change in work note

## 2019-12-05 NOTE — Telephone Encounter (Signed)
Order placed for MRI of right wrist.

## 2019-12-05 NOTE — ED Provider Notes (Signed)
[facility]    CSN: [id number] Arrival date & time: 12/05/19  1402      History   Chief Complaint Chief Complaint  Patient presents with  . Shortness of Breath  . Cough  . Nasal Drainage    HPI Sheena Petty is a 39 y.o. female.   HPI   Patient states that her boyfriend went traveling for vacation.  He came back and was sick.  He developed pneumonia.  He was found to have COVID-19.  Patient states for the last 2 days she has had postnasal drainage, body aches, headache, sore throat, cough, and shortness of breath.  She feels very tired.  Has not noted sweats chills or fever.  History reviewed. No pertinent past medical history.  There are no problems to display for this patient.   Past Surgical History:  Procedure Laterality Date  . CESAREAN SECTION    . TUBAL LIGATION      OB History   No obstetric history on file.      Home Medications    Prior to Admission medications   Medication Sig Start Date End Date Taking? Authorizing Provider  meloxicam (MOBIC) 15 MG tablet Take 1 tablet (15 mg total) by mouth daily as needed. Take with food. 08/17/19   Christena Deem, MD    Family History Family History  Problem Relation Age of Onset  . Asthma Mother   . Hypertension Mother   . Healthy Father     Social History Social History   Tobacco Use  . Smoking status: Current Some Day Smoker    Packs/day: 0.30    Types: Cigarettes  . Smokeless tobacco: Never Used  . Tobacco comment: 6 per day  Substance Use Topics  . Alcohol use: Yes  . Drug use: No     Allergies   Patient has no known allergies.   Review of Systems Review of Systems  Constitutional: Positive for fatigue. Negative for appetite change.  HENT: Positive for congestion, postnasal drip and rhinorrhea.   Respiratory: Positive for cough and shortness of breath.   Gastrointestinal: Negative for diarrhea, nausea and vomiting.  Musculoskeletal: Positive for myalgias.    Neurological: Positive for headaches.     Physical Exam Triage Vital Signs ED Triage Vitals [12/05/19 1544]  Enc Vitals Group     BP 140/88     Pulse Rate 91     Resp 17     Temp 98.7 F (37.1 C)     Temp Source Oral     SpO2 100 %     Weight      Height      Head Circumference      Peak Flow      Pain Score 3     Pain Loc      Pain Edu?      Excl. in [place]?    No data found.  Updated Vital Signs BP 140/88 (BP Location: Right Arm)   Pulse 91   Temp 98.7 F (37.1 C) (Oral)   Resp 17   LMP 12/02/2019   SpO2 100%       Physical Exam Constitutional:      General: She is not in acute distress.    Appearance: She is well-developed and normal weight.     Comments: Appears tired  HENT:     Head: Normocephalic and atraumatic.     Mouth/Throat:     Comments: Mask is in place Eyes:     Conjunctiva/sclera:  Conjunctivae normal.     Pupils: Pupils are equal, round, and reactive to light.  Cardiovascular:     Rate and Rhythm: Normal rate and regular rhythm.     Heart sounds: Normal heart sounds.  Pulmonary:     Effort: Pulmonary effort is normal. No respiratory distress.     Breath sounds: Normal breath sounds.     Comments: Lungs are clear Musculoskeletal:        General: Normal range of motion.     Cervical back: Normal range of motion.  Lymphadenopathy:     Cervical: No cervical adenopathy.  Skin:    General: Skin is warm and dry.  Neurological:     General: No focal deficit present.     Mental Status: She is alert.  Psychiatric:        Mood and Affect: Mood normal.        Behavior: Behavior normal.      UC Treatments / Results  Labs (all labs ordered are listed, but only abnormal results are displayed) Labs Reviewed  NOVEL CORONAVIRUS, NAA (HOSP ORDER, SEND-OUT TO REF LAB; TAT 18-24 HRS)    EKG   Radiology No results found.  Procedures Procedures (including critical care time)  Medications Ordered in UC Medications - No data to  display  Initial Impression / Assessment and Plan / UC Course  I have reviewed the triage vital signs and the nursing notes.  Pertinent labs & imaging results that were available during my care of the patient were reviewed by me and considered in my medical decision making (see chart for details).     Reviewed signs and symptoms of coronavirus.  Need to quarantine.  Turn to work information Final Clinical Impressions(s) / UC Diagnoses   Final diagnoses:  Viral URI with cough  Close exposure to COVID-19 virus     Discharge Instructions     Go home to rest Drink plenty of fluids Take Tylenol for pain or fever You may take over-the-counter cough and cold medicines as needed You must quarantine at home until your test result is available You can check for your test result in MyChart Call for advice or change in work note    ED Prescriptions    None     PDMP not reviewed this encounter.   Raylene Everts, MD 12/05/19 [id number]

## 2019-12-07 LAB — NOVEL CORONAVIRUS, NAA (HOSP ORDER, SEND-OUT TO REF LAB; TAT 18-24 HRS): SARS-CoV-2, NAA: NOT DETECTED

## 2019-12-16 ENCOUNTER — Encounter (HOSPITAL_COMMUNITY): Payer: Self-pay

## 2019-12-16 ENCOUNTER — Ambulatory Visit (HOSPITAL_COMMUNITY)
Admission: EM | Admit: 2019-12-16 | Discharge: 2019-12-16 | Disposition: A | Discharge status: Home / Self Care | Payer: 59 | Attending: Physician Assistant | Admitting: Physician Assistant

## 2019-12-16 ENCOUNTER — Other Ambulatory Visit: Payer: Self-pay

## 2019-12-16 DIAGNOSIS — J329 Chronic sinusitis, unspecified: Secondary | ICD-10-CM | POA: Diagnosis not present

## 2019-12-16 DIAGNOSIS — I452 Bifascicular block: Secondary | ICD-10-CM | POA: Diagnosis not present

## 2019-12-16 DIAGNOSIS — B9689 Other specified bacterial agents as the cause of diseases classified elsewhere: Secondary | ICD-10-CM

## 2019-12-16 DIAGNOSIS — R6889 Other general symptoms and signs: Secondary | ICD-10-CM

## 2019-12-16 MED ORDER — AZELASTINE HCL 0.05 % OP SOLN: 1.0000 [drp] | Freq: Two times a day (BID) | OPHTHALMIC | 12 refills | Status: DC

## 2019-12-16 MED ORDER — AMOXICILLIN-POT CLAVULANATE 875-125 MG PO TABS: 1.0000 | ORAL_TABLET | Freq: Two times a day (BID) | ORAL | 0 refills | Status: DC

## 2019-12-16 MED ORDER — SALINE SPRAY 0.65 % NA SOLN: 1.0000 | NASAL | 0 refills | Status: DC | PRN

## 2019-12-16 NOTE — ED Provider Notes (Signed)
[facility]    CSN: [id number] Arrival date & time: 12/16/19  1707      History   Chief Complaint Chief Complaint  Patient presents with  . Sinus Problem    HPI Sheena Petty is a 39 y.o. female.   Patient reports to urgent care today for continued nasal congestion, facial pain and swelling below her eyes. She was seen for upper respiratory infection on 12/05/2019 with known COVID exposure from her boyfriend. She tested negative for covid. All of her symptoms have resolved with exception of nasal congestion and facial pain. The congestion is yellow now. She denies fever, chills, cough, nausea, vomiting , diarrhea.  She does endorse some shortness of breath over the last few weeks with activity. She denies chest pain, sweating or nausea with this and it subsides with rest. She reports going to a nutritionist on 12/07/2019 and they did an ECG and she was told to follow up with her primary care because it was abnormal. She has never had an ECG prior to this.      History reviewed. No pertinent past medical history.  There are no problems to display for this patient.   Past Surgical History:  Procedure Laterality Date  . CESAREAN SECTION    . TUBAL LIGATION      OB History   No obstetric history on file.      Home Medications    Prior to Admission medications   Medication Sig Start Date End Date Taking? Authorizing Provider  amoxicillin-clavulanate (AUGMENTIN) 875-125 MG tablet Take 1 tablet by mouth every 12 (twelve) hours for 10 days. 12/16/19 12/26/19  Raushanah Osmundson, Veryl Speak, PA-C  azelastine (OPTIVAR) 0.05 % ophthalmic solution Place 1 drop into both eyes 2 (two) times daily. 12/16/19   Gemayel Mascio, Veryl Speak, PA-C  meloxicam (MOBIC) 15 MG tablet Take 1 tablet (15 mg total) by mouth daily as needed. Take with food. 08/17/19   Christena Deem, MD  sodium chloride (OCEAN) 0.65 % SOLN nasal spray Place 1 spray into both nostrils as needed for up to 10 days for congestion. 12/16/19  12/26/19  Livi Mcgann, Veryl Speak, PA-C    Family History Family History  Problem Relation Age of Onset  . Asthma Mother   . Hypertension Mother   . Healthy Father     Social History Social History   Tobacco Use  . Smoking status: Current Some Day Smoker    Packs/day: 0.30    Types: Cigarettes  . Smokeless tobacco: Never Used  . Tobacco comment: 6 per day  Substance Use Topics  . Alcohol use: Yes  . Drug use: No     Allergies   Patient has no known allergies.   Review of Systems Review of Systems  Constitutional: Negative for chills, diaphoresis, fatigue and fever.  HENT: Positive for congestion, postnasal drip and rhinorrhea. Negative for ear pain and sore throat.   Eyes: Negative for pain and visual disturbance.  Respiratory: Positive for shortness of breath. Negative for cough and chest tightness.   Cardiovascular: Negative for chest pain, palpitations and leg swelling.  Gastrointestinal: Negative for abdominal pain, diarrhea, nausea and vomiting.  Genitourinary: Negative for dysuria and hematuria.  Musculoskeletal: Negative for arthralgias and back pain.  Skin: Negative for color change and rash.  Neurological: Negative for seizures, syncope and headaches.  All other systems reviewed and are negative.    Physical Exam Triage Vital Signs ED Triage Vitals  Enc Vitals Group     BP 12/16/19  1738 (!) 141/96     Pulse Rate 12/16/19 1738 81     Resp 12/16/19 1738 18     Temp 12/16/19 1738 98.7 F (37.1 C)     Temp Source 12/16/19 1738 Oral     SpO2 12/16/19 1738 99 %     Weight 12/16/19 1737 190 lb (86.2 kg)     Height --      Head Circumference --      Peak Flow --      Pain Score 12/16/19 1737 7     Pain Loc --      Pain Edu? --      Excl. in [place]? --    No data found.  Updated Vital Signs BP (!) 141/96 (BP Location: Right Arm)   Pulse 81   Temp 98.7 F (37.1 C) (Oral)   Resp 18   Wt 190 lb (86.2 kg)   LMP 12/06/2019   SpO2 99%   BMI 34.75 kg/m    Visual Acuity Right Eye Distance:   Left Eye Distance:   Bilateral Distance:    Right Eye Near:   Left Eye Near:    Bilateral Near:     Physical Exam Vitals and nursing note reviewed.  Constitutional:      General: She is not in acute distress.    Appearance: Normal appearance. She is well-developed. She is not ill-appearing or diaphoretic.  HENT:     Head: Normocephalic and atraumatic.     Right Ear: Tympanic membrane normal.     Left Ear: Tympanic membrane normal.     Nose: Congestion and rhinorrhea present.     Right Turbinates: Enlarged.     Left Turbinates: Enlarged.     Right Sinus: Maxillary sinus tenderness present.     Left Sinus: Maxillary sinus tenderness present.     Comments: Purulent discharge noted bilaterally in turbinates    Mouth/Throat:     Mouth: Mucous membranes are moist.     Pharynx: Oropharynx is clear.  Eyes:     General: No scleral icterus.    Extraocular Movements: Extraocular movements intact.     Conjunctiva/sclera: Conjunctivae normal.     Pupils: Pupils are equal, round, and reactive to light.  Cardiovascular:     Rate and Rhythm: Normal rate and regular rhythm.     Heart sounds: Normal heart sounds. No murmur. No friction rub. No gallop.   Pulmonary:     Effort: Pulmonary effort is normal. No respiratory distress.     Breath sounds: Normal breath sounds. No wheezing, rhonchi or rales.  Abdominal:     Palpations: Abdomen is soft.     Tenderness: There is no abdominal tenderness.  Musculoskeletal:     Cervical back: Neck supple.     Right lower leg: No edema.     Left lower leg: No edema.  Skin:    General: Skin is warm and dry.  Neurological:     General: No focal deficit present.     Mental Status: She is alert and oriented to person, place, and time.  Psychiatric:        Mood and Affect: Mood normal.        Behavior: Behavior normal.        Thought Content: Thought content normal.        Judgment: Judgment normal.      UC  Treatments / Results  Labs (all labs ordered are listed, but only abnormal results are displayed) Labs Reviewed -  No data to display  EKG NSR with evidence of bifasicular block- agree with electronic reading  Radiology No results found.  Procedures Procedures (including critical care time)  Medications Ordered in UC Medications - No data to display  Initial Impression / Assessment and Plan / UC Course  I have reviewed the triage vital signs and the nursing notes.  Pertinent labs & imaging results that were available during my care of the patient were reviewed by me and considered in my medical decision making (see chart for details).     #Sinusitis Likely had a false negative COVID on 2/1, however she was 2 days into symptoms at that time and has had resolution of other symptoms at this point. Purulent nasal discharge and facial pain now.  - augmentin given, nasal saline   #itchy eyes History of allergies.  - azelastine   #bifasicular block Unknown chronicity. Currently asymptomatic. Has had shortness of breath with activity but it is difficult to say this is related. No syncope or chest pain. Discussed follow up with cardiology for continued work up - Strict ED precautions discussed. She understands and feels comfortable with plan  Final Clinical Impressions(s) / UC Diagnoses   Final diagnoses:  Bacterial sinusitis  Itchy eyes  Bifascicular block     Discharge Instructions     Your ECG did have some abnormal changes but they do not appear emergent given you are not experiencing chest pain or shortness of breath.  - if you develop chest pain or feel like you may pass out please go to the Emergency department - I would like for you to contact the cardiology clinic attached to discuss being seen and call your primary care office to ask to be seen sooner  I have sent in an antibiotic called augmentin, please take this 2 times a day for 10 days.  Use the saline nasal  spray often throughout the day and utilize the eye drops 2 times a day  You may begin taking zyrtec if youd like.       ED Prescriptions    Medication Sig Dispense Auth. Provider   amoxicillin-clavulanate (AUGMENTIN) 875-125 MG tablet Take 1 tablet by mouth every 12 (twelve) hours for 10 days. 20 tablet Quayshawn Nin, Veryl Speak, PA-C   sodium chloride (OCEAN) 0.65 % SOLN nasal spray Place 1 spray into both nostrils as needed for up to 10 days for congestion. 88 mL Pepe Mineau, Veryl Speak, PA-C   azelastine (OPTIVAR) 0.05 % ophthalmic solution Place 1 drop into both eyes 2 (two) times daily. 6 mL Shalita Notte, Veryl Speak, PA-C     PDMP not reviewed this encounter.   Hermelinda Medicus, PA-C 12/16/19 1836

## 2019-12-16 NOTE — ED Triage Notes (Addendum)
Pt states she is having sinus pressure and her sinues are acting up, this started 2 days ago. Pt states she was here last week with the same symptoms & was tested for COVID her results were NEGATIVE.

## 2019-12-16 NOTE — Discharge Instructions (Addendum)
Your ECG did have some abnormal changes but they do not appear emergent given you are not experiencing chest pain or shortness of breath.  - if you develop chest pain or feel like you may pass out please go to the Emergency department - I would like for you to contact the cardiology clinic attached to discuss being seen and call your primary care office to ask to be seen sooner  I have sent in an antibiotic called augmentin, please take this 2 times a day for 10 days.  Use the saline nasal spray often throughout the day and utilize the eye drops 2 times a day  You may begin taking zyrtec if youd like.

## 2019-12-19 NOTE — Progress Notes (Signed)
CARDIOLOGY CONSULT NOTE       Patient ID: RAZAN SILER MRN: [id number] DOB/AGE: 02-05-1981 39 y.o.  Admit date: (Not on file) Referring Physician: Darr,Jacob PA-C Primary Physician: [facility], [facility] Department Of Public Primary Cardiologist: New Reason for Consultation: Abnormal ECG  Active Problems:   * No active hospital problems. *   HPI:  39 y.o. recent COVID exposure tested negative Seen in ED 12/16/19 with sinus issues. Nasal congestion facial swelling below eyes With nasal congestion has had some dyspnea. No chest pain, palpitations or syncope No previously documented cardiac issues. 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 No old ECG to compare. She was Rx with Augmentin for her sinus infection   She had a similar ECG in February done at a weight loss clinic She has no primary No history of syncope palpitations No family history of PPM or Sarcoid.   She is sedentary with some functional dyspnea Has gained a lot of weight  She had two of her 3 children die in a car accident 1.5 years ago. Had her one Remaining daughter with her today   ROS All other systems reviewed and negative except as noted above  History reviewed. No pertinent past medical history.  Family History  Problem Relation Age of Onset  . Asthma Mother   . Hypertension Mother   . Healthy Father     Social History   Socioeconomic History  . Marital status: Single    Spouse name: Not on file  . Number of children: Not on file  . Years of education: Not on file  . Highest education level: Not on file  Occupational History  . Not on file  Tobacco Use  . Smoking status: Current Some Day Smoker    Packs/day: 0.30    Types: Cigarettes  . Smokeless tobacco: Never Used  . Tobacco comment: 6 per day  Substance and Sexual Activity  . Alcohol use: Yes  . Drug use: No  . Sexual activity: Never    Birth control/protection: Surgical  Other Topics Concern  . Not on file  Social  History Narrative  . Not on file   Social Determinants of Health   Financial Resource Strain:   . Difficulty of Paying Living Expenses: Not on file  Food Insecurity:   . Worried About Charity fundraiser in the Last Year: Not on file  . Ran Out of Food in the Last Year: Not on file  Transportation Needs:   . Lack of Transportation (Medical): Not on file  . Lack of Transportation (Non-Medical): Not on file  Physical Activity:   . Days of Exercise per Week: Not on file  . Minutes of Exercise per Session: Not on file  Stress:   . Feeling of Stress : Not on file  Social Connections:   . Frequency of Communication with Friends and Family: Not on file  . Frequency of Social Gatherings with Friends and Family: Not on file  . Attends Religious Services: Not on file  . Active Member of Clubs or Organizations: Not on file  . Attends Archivist Meetings: Not on file  . Marital Status: Not on file  Intimate Partner Violence:   . Fear of Current or Ex-Partner: Not on file  . Emotionally Abused: Not on file  . Physically Abused: Not on file  . Sexually Abused: Not on file    Past Surgical History:  Procedure Laterality Date  . CESAREAN SECTION    .  TUBAL LIGATION        Current Outpatient Medications:  .  amoxicillin-clavulanate (AUGMENTIN) 875-125 MG tablet, Take 1 tablet by mouth every 12 (twelve) hours for 10 days., Disp: 20 tablet, Rfl: 0 .  azelastine (OPTIVAR) 0.05 % ophthalmic solution, Place 1 drop into both eyes 2 (two) times daily., Disp: 6 mL, Rfl: 12 .  meloxicam (MOBIC) 15 MG tablet, Take 1 tablet (15 mg total) by mouth daily as needed. Take with food., Disp: 30 tablet, Rfl: 0 .  sodium chloride (OCEAN) 0.65 % SOLN nasal spray, Place 1 spray into both nostrils as needed for up to 10 days for congestion., Disp: 88 mL, Rfl: 0    Physical Exam: Height 5\' 2"  (1.575 m), last menstrual period 12/06/2019.   Affect appropriate Healthy:  appears stated age HEENT:  normal Neck supple with no adenopathy JVP normal no bruits no thyromegaly Lungs clear with no wheezing and good diaphragmatic motion Heart:  S1/S2 no murmur, no rub, gallop or click PMI normal Abdomen: benighn, BS positve, no tenderness, no AAA no bruit.  No HSM or HJR Distal pulses intact with no bruits No edema Neuro non-focal Skin warm and dry No muscular weakness   Labs:   Lab Results  Component Value Date   WBC 18.8 (H) 08/31/2019   HGB 13.1 08/31/2019   HCT 41.4 08/31/2019   MCV 90.8 08/31/2019   PLT 280 08/31/2019    Radiology: No results found.  EKG: SR rate 80 normal PR RBBB/LAFB 12/16/19    ASSESSMENT AND PLAN:   1. Abnormal ECG: no old one to compare no indication for PPM RBBB/LAFB should have yearly ECG Start with TTE to r/o structural heart disease. Can consider MRI if higher degree heart block develops to r/o sarcoid or other infiltrative disease If echo is normal would seemly recommend yearly ECG   2. Sinus:  Post Rx with Augmentin f/u with primary   Signed: 02/13/20 12/21/2019, 2:51 PM

## 2019-12-21 ENCOUNTER — Ambulatory Visit (INDEPENDENT_AMBULATORY_CARE_PROVIDER_SITE_OTHER): Payer: 59 | Admitting: Cardiovascular Disease

## 2019-12-21 ENCOUNTER — Other Ambulatory Visit: Payer: Self-pay

## 2019-12-21 ENCOUNTER — Encounter: Payer: Self-pay | Admitting: Cardiovascular Disease

## 2019-12-21 ENCOUNTER — Encounter (INDEPENDENT_AMBULATORY_CARE_PROVIDER_SITE_OTHER): Payer: Self-pay

## 2019-12-21 VITALS — BP 118/90 | HR 94 | Ht 62.0 in | Wt 187.8 lb

## 2019-12-21 DIAGNOSIS — R9431 Abnormal electrocardiogram [ECG] [EKG]: Secondary | ICD-10-CM | POA: Diagnosis not present

## 2019-12-21 NOTE — Patient Instructions (Signed)
Medication Instructions:   *If you need a refill on your cardiac medications before your next appointment, please call your pharmacy*  Lab Work:  If you have labs (blood work) drawn today and your tests are completely normal, you will receive your results only by: Marland Kitchen MyChart Message (if you have MyChart) OR . A paper copy in the mail If you have any lab test that is abnormal or we need to change your treatment, we will call you to review the results.  Testing/Procedures: Your physician has requested that you have an echocardiogram. Echocardiography is a painless test that uses sound waves to create images of your heart. It provides your doctor with information about the size and shape of your heart and how well your heart's chambers and valves are working. This procedure takes approximately one hour. There are no restrictions for this procedure.  Follow-Up: At [facility], you and your health needs are our priority.  As part of our continuing mission to provide you with exceptional heart care, we have created designated Provider Care Teams.  These Care Teams include your primary Cardiologist (physician) and Advanced Practice Providers (APPs -  Physician Assistants and Nurse Practitioners) who all work together to provide you with the care you need, when you need it.  Your next appointment:   As needed

## 2019-12-22 ENCOUNTER — Other Ambulatory Visit: Payer: Self-pay

## 2019-12-22 ENCOUNTER — Ambulatory Visit (INDEPENDENT_AMBULATORY_CARE_PROVIDER_SITE_OTHER)
Admission: RE | Admit: 2019-12-22 | Discharge: 2019-12-22 | Disposition: A | Discharge status: Home / Self Care | Payer: 59 | Source: Ambulatory Visit

## 2019-12-22 DIAGNOSIS — B373 Candidiasis of vulva and vagina: Secondary | ICD-10-CM

## 2019-12-22 DIAGNOSIS — B3731 Acute candidiasis of vulva and vagina: Secondary | ICD-10-CM

## 2019-12-22 MED ORDER — FLUCONAZOLE 200 MG PO TABS: 200.0000 mg | ORAL_TABLET | Freq: Once | ORAL | 0 refills | Status: AC

## 2019-12-22 NOTE — Discharge Instructions (Addendum)
Take diflucan as prescribed. Return for worsening itching, vaginal discharge, pelvic/abdominal pain, back pain, fever.

## 2019-12-22 NOTE — ED Provider Notes (Signed)
Virtual Visit via Video Note:  Sheena Petty  initiated request for Telemedicine visit with [facility] Urgent Care team. I connected with Sheena Petty  on 12/22/2019 at 2:38 PM  for a synchronized telemedicine visit using a video enabled HIPPA compliant telemedicine application. I verified that I am speaking with Sheena Petty  using two identifiers. [place] Hall-Potvin, PA-C  was physically located in a [facility] Urgent care site and Sheena Petty was located at a different location.   The limitations of evaluation and management by telemedicine as well as the availability of in-person appointments were discussed. Patient was informed that she  may incur a bill ( including co-pay) for this virtual visit encounter. Sheena Petty  expressed understanding and gave verbal consent to proceed with virtual visit.     History of Present Illness:Sheena Petty  is a 39 y.o. female presents with concern for vaginal yeast infection.  Has been having significant pruritus since yesterday which is now caused her to have some vulvar burning with urination.  Endorsing remote history of yeast infection: Last flare 2 years ago after antibiotic use-states this feels the same.  Denies urinary frequency, urgency, dysuria, hematuria.  No fever, abdominal/pelvic pain, back pain, vaginal discharge or anogenital lesions.  Denies history of frequent UTIs, diabetes.  No recent antibiotic use.  LMP 12/09/2019: Status post BTL.   ROI as per HPI  No past medical history on file.  No Known Allergies      Observations/Objective: 39 y.o. female Sitting in no acute distress.  Patient is able to speak in full sentences without coughing, sneezing, wheezing.  Assessment and Plan: H&P concerning for yeast vaginitis: Diflucan sent.  Low concern for pregnancy, UTI given history though discussed cannot rule this out via virtual visit.  Return precautions discussed, patient verbalized understanding and is agreeable  to plan.  Follow Up Instructions: Seek in-person evaluation for persistent, worsening symptoms.   I discussed the assessment and treatment plan with the patient. The patient was provided an opportunity to ask questions and all were answered. The patient agreed with the plan and demonstrated an understanding of the instructions.   The patient was advised to call back or seek an in-person evaluation if the symptoms worsen or if the condition fails to improve as anticipated.  I provided 15 minutes of non-face-to-face time during this encounter.    [place] Hall-Potvin, PA-C  12/22/2019 2:38 PM         Hall-Potvin, [place], PA-C 12/22/19 1441

## 2020-01-05 ENCOUNTER — Other Ambulatory Visit (HOSPITAL_COMMUNITY): Payer: 59

## 2020-01-12 ENCOUNTER — Other Ambulatory Visit: Payer: Self-pay

## 2020-01-12 ENCOUNTER — Ambulatory Visit
Admission: RE | Admit: 2020-01-12 | Discharge: 2020-01-12 | Disposition: A | Discharge status: Home / Self Care | Payer: 59 | Source: Ambulatory Visit | Attending: Family Medicine | Admitting: Family Medicine

## 2020-01-12 DIAGNOSIS — M25531 Pain in right wrist: Secondary | ICD-10-CM

## 2020-01-13 ENCOUNTER — Ambulatory Visit: Payer: 59 | Admitting: Family Medicine

## 2020-01-13 NOTE — Progress Notes (Deleted)
    SUBJECTIVE:   CHIEF COMPLAINT / HPI:   HM -HIV -Hep C? -pap -flu  PERTINENT  PMH / PSH: ***  OBJECTIVE:   There were no vitals taken for this visit.  ***  ASSESSMENT/PLAN:   No problem-specific Assessment & Plan notes found for this encounter.     Mirian Mo, MD [facility]

## 2020-01-14 ENCOUNTER — Ambulatory Visit (INDEPENDENT_AMBULATORY_CARE_PROVIDER_SITE_OTHER)
Admission: RE | Admit: 2020-01-14 | Discharge: 2020-01-14 | Disposition: A | Discharge status: Home / Self Care | Payer: 59 | Source: Ambulatory Visit

## 2020-01-14 DIAGNOSIS — K219 Gastro-esophageal reflux disease without esophagitis: Secondary | ICD-10-CM

## 2020-01-14 DIAGNOSIS — K2900 Acute gastritis without bleeding: Secondary | ICD-10-CM

## 2020-01-14 MED ORDER — SUCRALFATE 1 G PO TABS: 1.0000 g | ORAL_TABLET | Freq: Three times a day (TID) | ORAL | 0 refills | Status: DC

## 2020-01-14 MED ORDER — OMEPRAZOLE 40 MG PO CPDR: 40.0000 mg | DELAYED_RELEASE_CAPSULE | Freq: Every day | ORAL | 0 refills | Status: DC

## 2020-01-14 NOTE — Discharge Instructions (Addendum)
Important to take medication as prescribed. Please review food choices for GERD, see attached. Follow-up with PCP for persistent, worsening symptoms as well as refills on any medications.

## 2020-01-14 NOTE — ED Provider Notes (Signed)
Virtual Visit via Video Note:  Sheena Petty  initiated request for Telemedicine visit with [facility] Urgent Care team. I connected with Sheena Petty  on 01/14/2020 at 9:04 AM  for a synchronized telemedicine visit using a video enabled HIPPA compliant telemedicine application. I verified that I am speaking with Sheena Petty  using two identifiers. [place] Hall-Potvin, PA-C  was physically located in a [facility] Urgent care site and Sheena Petty was located at a different location.   The limitations of evaluation and management by telemedicine as well as the availability of in-person appointments were discussed. Patient was informed that she  may incur a bill ( including co-pay) for this virtual visit encounter. Sheena Petty  expressed understanding and gave verbal consent to proceed with virtual visit.     History of Present Illness:Sheena Petty  is a 39 y.o. female presents with request for GERD medication.  States she used to take something years ago, though cannot remember the name, for the same thing.  Endorsing increased heartburn with some vomiting (no bile/blood).  States heartburn has been bothering her particularly for the last few days.  Denies abdominal pain, chest pain, difficulty breathing.  No fever, chills, myalgias or arthralgias.  Denies hematochezia, melena.  Not currently take anything for her heartburn.    No past medical history on file.  No Known Allergies      Observations/Objective: 39 y.o. female sitting in no acute distress.  Patient is able to speak in full sentences without coughing, sneezing, wheezing.  Assessment and Plan: H&P concerning for gastritis second to uncontrolled GERD.  Will treat supportively as outlined in discharge instructions.  Patient to follow-up with PCP for further evaluation/management if needed.  Follow Up Instructions: Patient to seek in-person evaluation for persistent or worsening symptoms.   I discussed the  assessment and treatment plan with the patient. The patient was provided an opportunity to ask questions and all were answered. The patient agreed with the plan and demonstrated an understanding of the instructions.   The patient was advised to call back or seek an in-person evaluation if the symptoms worsen or if the condition fails to improve as anticipated.  I provided 15 minutes of non-face-to-face time during this encounter.    [place] Hall-Potvin, PA-C  01/14/2020 9:04 AM         Hall-Potvin, [place], PA-C 01/14/20 1007

## 2020-01-25 ENCOUNTER — Ambulatory Visit: Payer: 59 | Admitting: Sports Medicine

## 2020-01-26 ENCOUNTER — Other Ambulatory Visit (HOSPITAL_COMMUNITY): Payer: 59

## 2020-02-01 ENCOUNTER — Ambulatory Visit: Payer: 59 | Admitting: Sports Medicine

## 2020-02-14 ENCOUNTER — Encounter (HOSPITAL_COMMUNITY): Payer: Self-pay | Admitting: Cardiovascular Disease

## 2020-02-24 ENCOUNTER — Telehealth (HOSPITAL_COMMUNITY): Payer: Self-pay | Admitting: Cardiovascular Disease

## 2020-02-24 NOTE — Telephone Encounter (Signed)
Just an FYI. We have made several attempts to contact this patient including sending a letter to schedule or reschedule their echocardiogram. We will be removing the patient from the echo WQ.   02/14/20 Mailed Letter  02/03/20 [facility] to schedule No SHOW echo @ 10:37/[facility]  01/26/20 [facility] to schedule NO SHOW ECHO @ 1:34/[facility]      Thank you

## 2020-06-10 ENCOUNTER — Emergency Department (HOSPITAL_COMMUNITY): Payer: Self-pay

## 2020-06-10 ENCOUNTER — Emergency Department (HOSPITAL_COMMUNITY)
Admission: EM | Admit: 2020-06-10 | Discharge: 2020-06-11 | Disposition: A | Discharge status: Home / Self Care | Payer: Self-pay | Attending: Emergency Medicine | Admitting: Emergency Medicine

## 2020-06-10 ENCOUNTER — Encounter (HOSPITAL_COMMUNITY): Payer: Self-pay

## 2020-06-10 ENCOUNTER — Other Ambulatory Visit: Payer: Self-pay

## 2020-06-10 DIAGNOSIS — F17210 Nicotine dependence, cigarettes, uncomplicated: Secondary | ICD-10-CM | POA: Insufficient documentation

## 2020-06-10 DIAGNOSIS — K439 Ventral hernia without obstruction or gangrene: Secondary | ICD-10-CM | POA: Insufficient documentation

## 2020-06-10 LAB — COMPREHENSIVE METABOLIC PANEL
ALT: 37 U/L (ref 0–44)
AST: 37 U/L (ref 15–41)
Albumin: 4.1 g/dL (ref 3.5–5.0)
Alkaline Phosphatase: 84 U/L (ref 38–126)
Anion gap: 11 (ref 5–15)
BUN: 8 mg/dL (ref 6–20)
CO2: 25 mmol/L (ref 22–32)
Calcium: 8.3 mg/dL — ABNORMAL LOW (ref 8.9–10.3)
Chloride: 102 mmol/L (ref 98–111)
Creatinine, Ser: 0.81 mg/dL (ref 0.44–1.00)
GFR calc Af Amer: >60 mL/min (ref >60)
GFR calc non Af Amer: >60 mL/min (ref >60)
Glucose, Bld: 128 mg/dL — ABNORMAL HIGH (ref 70–99)
Potassium: 3.4 mmol/L — ABNORMAL LOW (ref 3.5–5.1)
Sodium: 138 mmol/L (ref 135–145)
Total Bilirubin: 0.4 mg/dL (ref 0.3–1.2)
Total Protein: 7.4 g/dL (ref 6.5–8.1)

## 2020-06-10 LAB — CBC
HCT: 41.5 % (ref 36.0–46.0)
Hemoglobin: 13.7 g/dL (ref 12.0–15.0)
MCH: 29.5 pg (ref 26.0–34.0)
MCHC: 33 g/dL (ref 30.0–36.0)
MCV: 89.2 fL (ref 80.0–100.0)
Platelets: 288 10*3/uL (ref 150–400)
RBC: 4.65 MIL/uL (ref 3.87–5.11)
RDW: 13.9 % (ref 11.5–15.5)
WBC: 10.1 10*3/uL (ref 4.0–10.5)
nRBC: 0 % (ref 0.0–0.2)

## 2020-06-10 LAB — POC OCCULT BLOOD, ED: Fecal Occult Bld: NEGATIVE

## 2020-06-10 LAB — I-STAT BETA HCG BLOOD, ED (MC, WL, AP ONLY): I-stat hCG, quantitative: <5 m[IU]/mL (ref <5)

## 2020-06-10 LAB — TYPE AND SCREEN
ABO/RH(D): O POS
Antibody Screen: NEGATIVE

## 2020-06-10 LAB — ABO/RH: ABO/RH(D): O POS

## 2020-06-10 MED ORDER — IOHEXOL 300 MG/ML  SOLN
100.0000 mL | Freq: Once | INTRAMUSCULAR | Status: AC | PRN
  Administered 2020-06-10: 100 mL via INTRAVENOUS

## 2020-06-10 MED ORDER — HYDROCODONE-ACETAMINOPHEN 5-325 MG PO TABS: 2.0000 | ORAL_TABLET | ORAL | 0 refills | Status: DC | PRN

## 2020-06-10 MED ORDER — HYDROMORPHONE HCL 1 MG/ML IJ SOLN
1.0000 mg | Freq: Once | INTRAMUSCULAR | Status: AC
  Administered 2020-06-10: 1 mg via INTRAVENOUS
  Filled 2020-06-10: qty 1

## 2020-06-10 MED ORDER — SODIUM CHLORIDE (PF) 0.9 % IJ SOLN
INTRAMUSCULAR | Status: AC
  Filled 2020-06-10: qty 50

## 2020-06-10 NOTE — ED Notes (Signed)
Second type and screen tube drawn per lab instruction.

## 2020-06-10 NOTE — ED Provider Notes (Signed)
[facility]-EMERGENCY DEPT Provider Note   CSN: [id number] Arrival date & time: 06/10/20  1326     History Chief Complaint  Patient presents with   Abdominal Pain   GI Bleeding    Sheena Petty is a 39 y.o. female.  HPI 39 year old female with no significant medical history resents to the ER with complaints of a knot in her stomach with associated nausea, vomiting, and several episodes of diarrhea.  Patient states that she has been having this mass in her belly on and off for several years.  However yesterday she was on the toilet, and noticed that she had a bump above her bellybutton.  She says that the mass has been painful.  She also reports some episodes of nausea and nonbloody nonbilious vomiting last night.  She reports some diarrhea and to triage reported dark stools with blood, however she states that that has not happened in quite some time.  She had a bowel movement this morning which had no blood in it.  Denies any fevers or chills.  No cough or chest pain or shortness of breath.  She had a tubal ligation and cesarean section.    History reviewed. No pertinent past medical history.  There are no problems to display for this patient.   Past Surgical History:  Procedure Laterality Date   CESAREAN SECTION     TUBAL LIGATION       OB History   No obstetric history on file.     Family History  Problem Relation Age of Onset   Asthma Mother    Hypertension Mother    Healthy Father     Social History   Tobacco Use   Smoking status: Current Some Day Smoker    Packs/day: 0.30    Types: Cigarettes   Smokeless tobacco: Never Used   Tobacco comment: 6 per day  Vaping Use   Vaping Use: Never used  Substance Use Topics   Alcohol use: Yes   Drug use: No    Home Medications Prior to Admission medications   Medication Sig Start Date End Date Taking? Authorizing Provider  azelastine (OPTIVAR) 0.05 % ophthalmic solution Place 1  drop into both eyes 2 (two) times daily. 12/16/19   Darr, Veryl Speak, PA-C  HYDROcodone-acetaminophen (NORCO/VICODIN) 5-325 MG tablet Take 2 tablets by mouth every 4 (four) hours as needed. 06/10/20   Mare Ferrari, PA-C  omeprazole (PRILOSEC) 40 MG capsule Take 1 capsule (40 mg total) by mouth daily. 01/14/20   Hall-Potvin, [place], PA-C  sodium chloride (OCEAN) 0.65 % SOLN nasal spray Place 1 spray into both nostrils as needed for up to 10 days for congestion. 12/16/19 12/26/19  Darr, Veryl Speak, PA-C  sucralfate (CARAFATE) 1 g tablet Take 1 tablet (1 g total) by mouth 4 (four) times daily -  with meals and at bedtime for 14 days. 01/14/20 01/28/20  Hall-Potvin, [place], PA-C    Allergies    Other  Review of Systems   Review of Systems  Constitutional: Negative for chills and fever.  HENT: Negative for ear pain and sore throat.   Eyes: Negative for pain and visual disturbance.  Respiratory: Negative for cough and shortness of breath.   Cardiovascular: Negative for chest pain and palpitations.  Gastrointestinal: Positive for abdominal pain, diarrhea, nausea and vomiting.  Genitourinary: Negative for dysuria and hematuria.  Musculoskeletal: Negative for arthralgias and back pain.  Skin: Negative for color change and rash.  Neurological: Negative for seizures and  syncope.  All other systems reviewed and are negative.   Physical Exam Updated Vital Signs BP (!) 153/113    Pulse 69    Temp 98.7 F (37.1 C) (Oral)    Resp 17    Ht 5\' 2"  (1.575 m)    Wt 81.6 kg    LMP 06/05/2020    SpO2 99%    BMI 32.92 kg/m   Physical Exam Vitals and nursing note reviewed.  Constitutional:      General: She is not in acute distress.    Appearance: She is well-developed. She is obese.     Comments: Uncomfortable appearing  HENT:     Head: Normocephalic and atraumatic.  Eyes:     Conjunctiva/sclera: Conjunctivae normal.  Cardiovascular:     Rate and Rhythm: Normal rate and regular rhythm.     Heart sounds:  No murmur heard.   Pulmonary:     Effort: Pulmonary effort is normal. No respiratory distress.     Breath sounds: Normal breath sounds.  Abdominal:     General: Abdomen is flat. Bowel sounds are normal.     Palpations: Abdomen is soft.     Tenderness: There is no abdominal tenderness.     Hernia: A hernia is present. Hernia is present in the ventral area.     Comments: 2 cm x 3 cm mass just above the umbilicus.  Tender to palpation.  No evidence of erythema, ischemia, gangrene.  Slightly mobile.  Genitourinary:    Rectum: Guaiac result negative.     Comments: Visible hemorrhoids on external rectal exam Musculoskeletal:     Cervical back: Neck supple.  Skin:    General: Skin is warm and dry.  Neurological:     General: No focal deficit present.     Mental Status: She is alert and oriented to person, place, and time.  Psychiatric:        Mood and Affect: Mood normal.        Behavior: Behavior normal.     ED Results / Procedures / Treatments   Labs (all labs ordered are listed, but only abnormal results are displayed) Labs Reviewed  COMPREHENSIVE METABOLIC PANEL - Abnormal; Notable for the following components:      Result Value   Potassium 3.4 (*)    Glucose, Bld 128 (*)    Calcium 8.3 (*)    All other components within normal limits  CBC  POC OCCULT BLOOD, ED  I-STAT BETA HCG BLOOD, ED ([facility], [facility], [facility] ONLY)  POC OCCULT BLOOD, ED  TYPE AND SCREEN  ABO/RH    EKG None  Radiology CT ABDOMEN PELVIS W CONTRAST  Result Date: 06/10/2020 CLINICAL DATA:  Abdominal pain. EXAM: CT ABDOMEN AND PELVIS WITH CONTRAST TECHNIQUE: Multidetector CT imaging of the abdomen and pelvis was performed using the standard protocol following bolus administration of intravenous contrast. CONTRAST:  08/10/2020 OMNIPAQUE IOHEXOL 300 MG/ML  SOLN COMPARISON:  August 31, 2019 FINDINGS: Lower chest: No acute abnormality. Hepatobiliary: There is diffuse fatty infiltration of the liver parenchyma. No focal liver  abnormality is seen. No gallstones, gallbladder wall thickening, or biliary dilatation. Pancreas: Unremarkable. No pancreatic ductal dilatation or surrounding inflammatory changes. Spleen: Normal in size without focal abnormality. Adrenals/Urinary Tract: Adrenal glands are unremarkable. Kidneys are normal in size, without renal calculi or hydronephrosis. A 1.4 cm exophytic cyst is seen along the lateral aspect of the mid left kidney. Bladder is unremarkable. Stomach/Bowel: Stomach is within normal limits. Appendix appears normal. No evidence of  bowel dilatation. Noninflamed diverticula are seen throughout the large bowel. Vascular/Lymphatic: No significant vascular findings are present. No enlarged abdominal or pelvic lymph nodes. Reproductive: Uterus is normal in size and appearance. Bilateral tubal ligation clips are seen. Other: There is a 3.3 cm x 2.1 cm fat containing ventral hernia is seen along the midline of the mid abdomen. No abdominopelvic ascites. Musculoskeletal: No acute or significant osseous findings. IMPRESSION: 1. Hepatic steatosis. 2. Noninflamed diverticula throughout the large bowel. 3. 3.3 cm x 2.1 cm fat containing ventral hernia. Electronically Signed   By: Aram Candela M.D.   On: 06/10/2020 16:54    Procedures Procedures (including critical care time)  Medications Ordered in ED Medications  sodium chloride (PF) 0.9 % injection (has no administration in time range)  HYDROmorphone (DILAUDID) injection 1 mg (1 mg Intravenous Given 06/10/20 1552)  iohexol (OMNIPAQUE) 300 MG/ML solution 100 mL (100 mLs Intravenous Contrast Given 06/10/20 1631)    ED Course  I have reviewed the triage vital signs and the nursing notes.  Pertinent labs & imaging results that were available during my care of the patient were reviewed by me and considered in my medical decision making (see chart for details).    MDM Rules/Calculators/A&P                         Patient with complaints of mass in  her upper abdomen which is painful, diarrhea, blood in stool. On presentation, she is alert, oriented, uncomfortable appearing,  but not actively vomiting in the ED.  Vitals overall reassuring.  She is afebrile.  Physical exam with 2 x 2 centimeter mass superior to the umbilicus.  Tender to palpation.  Hemoccult negative, no evidence of blood on rectal exam.  Patient does have some hemorrhoids on exam which could be contributing to her rectal bleeding.  CBC with without leukocytosis, stable hemoglobin of 13.7.  CMP without any severe electrolyte abnormalities, slight hypokalemia of 3.4 likely in the setting of vomiting.  Normal renal and hepatic function.  Calcium of 8.3 again likely in the setting of vomiting.  Negative pregnancy.  Patient was seen and eval by Dr. Marcell Barlow, will CT the abdomen to confirm hernia as this is an atypical presentation.  I personally reviewed her CT of her abdomen; confirms fat-containing ventral hernia.  Diverticula without evidence of diverticulitis seen on CT exam in the large bowel.  Patient stable with pain control, reports improvement in her symptoms with Dilaudid.  We will send her home with a short course of pain meds.  Have her follow-up with general surgery.  Strict return precautions discussed.  She voices understanding is agreeable to this plan.  Overall, the patient has been medically screened and is stable for discharge.  Final Clinical Impression(s) / ED Diagnoses Final diagnoses:  Ventral hernia without obstruction or gangrene    Rx / DC Orders ED Discharge Orders         Ordered    HYDROcodone-acetaminophen (NORCO/VICODIN) 5-325 MG tablet  Every 4 hours PRN     Discontinue  Reprint     06/10/20 1715           Mare Ferrari, PA-C 06/10/20 1718    Charlynne Pander, MD 06/13/20 1513

## 2020-06-10 NOTE — ED Triage Notes (Addendum)
Patient states "a knot popped up in my stomach yesterday." patient states increased pain with sneezing, coughing, and movement. Patient also c/o nausea, but denies vomiting. Patient added that she saw a large amount of dark blood in her stool 3 days ago.

## 2020-06-10 NOTE — Discharge Instructions (Addendum)
Please take the prescribed medication as needed for pain. You may also take Ibuprofen for pain. Please follow up with general surgery for your hernia. Return to the ER if your symptoms worsen.

## 2020-06-23 ENCOUNTER — Ambulatory Visit (HOSPITAL_COMMUNITY)
Admission: EM | Admit: 2020-06-23 | Discharge: 2020-06-23 | Discharge status: Against Medical Advice | Payer: Medicaid Other

## 2020-06-23 ENCOUNTER — Other Ambulatory Visit: Payer: Self-pay

## 2020-06-23 NOTE — ED Notes (Signed)
Per D. Watts, Patient Access: pt stated she was going to different urgent care due to wait time

## 2020-11-11 ENCOUNTER — Other Ambulatory Visit: Payer: Self-pay

## 2020-11-11 ENCOUNTER — Ambulatory Visit (HOSPITAL_COMMUNITY)
Admission: EM | Admit: 2020-11-11 | Discharge: 2020-11-11 | Disposition: A | Discharge status: Home / Self Care | Payer: HRSA Program | Attending: Urgent Care | Admitting: Urgent Care

## 2020-11-11 ENCOUNTER — Encounter (HOSPITAL_COMMUNITY): Payer: Self-pay

## 2020-11-11 DIAGNOSIS — F17210 Nicotine dependence, cigarettes, uncomplicated: Secondary | ICD-10-CM | POA: Insufficient documentation

## 2020-11-11 DIAGNOSIS — Z283 Underimmunization status: Secondary | ICD-10-CM | POA: Insufficient documentation

## 2020-11-11 DIAGNOSIS — J069 Acute upper respiratory infection, unspecified: Secondary | ICD-10-CM | POA: Diagnosis not present

## 2020-11-11 DIAGNOSIS — R52 Pain, unspecified: Secondary | ICD-10-CM

## 2020-11-11 DIAGNOSIS — Z20822 Contact with and (suspected) exposure to covid-19: Secondary | ICD-10-CM | POA: Insufficient documentation

## 2020-11-11 DIAGNOSIS — Z79899 Other long term (current) drug therapy: Secondary | ICD-10-CM | POA: Diagnosis not present

## 2020-11-11 DIAGNOSIS — R6883 Chills (without fever): Secondary | ICD-10-CM | POA: Insufficient documentation

## 2020-11-11 LAB — SARS CORONAVIRUS 2 (TAT 6-24 HRS): SARS Coronavirus 2: NEGATIVE

## 2020-11-11 MED ORDER — BENZONATATE 100 MG PO CAPS: 100.0000 mg | ORAL_CAPSULE | Freq: Three times a day (TID) | ORAL | 0 refills | Status: DC | PRN

## 2020-11-11 MED ORDER — CETIRIZINE HCL 10 MG PO TABS: 10.0000 mg | ORAL_TABLET | Freq: Every day | ORAL | 0 refills | Status: DC

## 2020-11-11 MED ORDER — PSEUDOEPHEDRINE HCL 60 MG PO TABS
60.0000 mg | ORAL_TABLET | Freq: Three times a day (TID) | ORAL | 0 refills | Status: DC | PRN
Start: 2020-11-11 — End: 2021-02-05

## 2020-11-11 MED ORDER — PROMETHAZINE-DM 6.25-15 MG/5ML PO SYRP
5.0000 mL | ORAL_SOLUTION | Freq: Every evening | ORAL | 0 refills | Status: DC | PRN
Start: 2020-11-11 — End: 2021-02-05

## 2020-11-11 NOTE — Discharge Instructions (Addendum)

## 2020-11-11 NOTE — ED Triage Notes (Signed)
Pt presents with cough, chills, nasal congestion and body aches x 1 day.

## 2020-11-11 NOTE — ED Provider Notes (Signed)
[facility]   MRN: [id number] DOB: 05-13-1981  Subjective:   Sheena Petty is a 40 y.o. female presenting for 1 day history of acute onset cough, chills, sinus congestion and body aches.  Patient had COVID exposure at work.  She has not vaccinated.  Denies history of lung disorders.  Patient is a light smoker.  No current facility-administered medications for this encounter.  Current Outpatient Medications:  .  azelastine (OPTIVAR) 0.05 % ophthalmic solution, Place 1 drop into both eyes 2 (two) times daily., Disp: 6 mL, Rfl: 12 .  HYDROcodone-acetaminophen (NORCO/VICODIN) 5-325 MG tablet, Take 2 tablets by mouth every 4 (four) hours as needed., Disp: 10 tablet, Rfl: 0 .  omeprazole (PRILOSEC) 40 MG capsule, Take 1 capsule (40 mg total) by mouth daily., Disp: 30 capsule, Rfl: 0 .  sodium chloride (OCEAN) 0.65 % SOLN nasal spray, Place 1 spray into both nostrils as needed for up to 10 days for congestion., Disp: 88 mL, Rfl: 0 .  sucralfate (CARAFATE) 1 g tablet, Take 1 tablet (1 g total) by mouth 4 (four) times daily -  with meals and at bedtime for 14 days., Disp: 56 tablet, Rfl: 0   Allergies  Allergen Reactions  . Other     Cherry fruit    History reviewed. No pertinent past medical history.   Past Surgical History:  Procedure Laterality Date  . CESAREAN SECTION    . TUBAL LIGATION      Family History  Problem Relation Age of Onset  . Asthma Mother   . Hypertension Mother   . Healthy Father     Social History   Tobacco Use  . Smoking status: Current Some Day Smoker    Packs/day: 0.30    Types: Cigarettes  . Smokeless tobacco: Never Used  . Tobacco comment: 6 per day  Vaping Use  . Vaping Use: Never used  Substance Use Topics  . Alcohol use: Yes  . Drug use: No    ROS   Objective:   Vitals: BP (!) 128/101 (BP Location: Left Arm)   Pulse 97   Temp 98 F (36.7 C) (Oral)   Resp 20   LMP  (Within Weeks) Comment: 1 week  SpO2 98%    Physical Exam Constitutional:      General: She is not in acute distress.    Appearance: Normal appearance. She is well-developed. She is not ill-appearing, toxic-appearing or diaphoretic.  HENT:     Head: Normocephalic and atraumatic.     Nose: Nose normal.     Mouth/Throat:     Mouth: Mucous membranes are moist.  Eyes:     Extraocular Movements: Extraocular movements intact.     Pupils: Pupils are equal, round, and reactive to light.  Cardiovascular:     Rate and Rhythm: Normal rate and regular rhythm.     Pulses: Normal pulses.     Heart sounds: Normal heart sounds. No murmur heard. No friction rub. No gallop.   Pulmonary:     Effort: Pulmonary effort is normal. No respiratory distress.     Breath sounds: Normal breath sounds. No stridor. No wheezing, rhonchi or rales.  Skin:    General: Skin is warm and dry.     Findings: No rash.  Neurological:     Mental Status: She is alert and oriented to person, place, and time.  Psychiatric:        Mood and Affect: Mood normal.  Behavior: Behavior normal.        Thought Content: Thought content normal.     Assessment and Plan :   PDMP not reviewed this encounter.  1. Viral URI with cough   2. Body aches     Will manage for viral illness such as viral URI, viral syndrome, viral rhinitis, COVID-19. Counseled patient on nature of COVID-19 including modes of transmission, diagnostic testing, management and supportive care.  Offered scripts for symptomatic relief. COVID 19 testing is pending. Counseled patient on potential for adverse effects with medications prescribed/recommended today, ER and return-to-clinic precautions discussed, patient verbalized understanding.     Wallis Bamberg, PA-C 11/11/20 1327

## 2021-01-25 ENCOUNTER — Ambulatory Visit: Payer: Self-pay | Admitting: General Surgery

## 2021-01-25 NOTE — H&P (Signed)
History of Present Illness Axel Filler MD; 02-07-2021 11:15 AM) The patient is a 40 year old female who presents with an abdominal wall hernia. Patient is a 40 year old female comes in secondary to a ventral hernia. Patient was recently in the ER secondary to abdominal pain. She states that she was lifting tokes at work and felt a pop. States that thereafter she noticed a bulge. She states that she was unable to stand up after a couple of days. She states that in the ER the did reduce the hernia. Patient states that she has had a CT scan. Reviewed this personally. As this did show a small 2 cm hernia at the falciform ligament. Patient since that time has had some on and off abdominal pain. She is able to reduce the hernia herself.    Past Surgical History Blanchie Dessert, [place]; Feb 07, 2021 11:03 AM) Cesarean Section - 1   Diagnostic Studies History Blanchie Dessert, [place]; 2021-02-07 11:03 AM) Colonoscopy  never Mammogram  never Pap Smear  >5 years ago  Allergies Blanchie Dessert, CMA; 2021/02/07 11:03 AM) No Known Drug Allergies  [2021-02-07]: Allergies Reconciled   Medication History Blanchie Dessert, [place]; 02/07/2021 11:04 AM) Medications Reconciled  Social History Blanchie Dessert, [place]; 02/07/21 11:03 AM) Alcohol use  Moderate alcohol use. Caffeine use  Coffee. No drug use  Tobacco use  Former smoker.  Family History Blanchie Dessert, [place]; 2021/02/07 11:03 AM) Family history unknown  First Degree Relatives   Pregnancy / Birth History Blanchie Dessert, [place]; 2021/02/07 11:03 AM) Age at menarche  12 years. Gravida  3 Irregular periods  Length (months) of breastfeeding  3-6 Maternal age  76-20 Para  3  Other Problems Blanchie Dessert, CMA; 2021/02/07 11:03 AM) Gastroesophageal Reflux Disease     Review of Systems Axel Filler MD; 07-Feb-2021 11:13 AM) General Not Present- Appetite Loss, Chills, Fatigue, Fever, Night Sweats, Weight Gain and Weight Loss. Skin Present- Dryness. Not Present-  Change in Wart/Mole, Hives, Jaundice, New Lesions, Non-Healing Wounds, Rash and Ulcer. HEENT Present- Seasonal Allergies. Not Present- Earache, Hearing Loss, Hoarseness, Nose Bleed, Oral Ulcers, Ringing in the Ears, Sinus Pain, Sore Throat, Visual Disturbances, Wears glasses/contact lenses and Yellow Eyes. Respiratory Not Present- Bloody sputum, Chronic Cough, Difficulty Breathing, Snoring and Wheezing. Breast Not Present- Breast Mass, Breast Pain, Nipple Discharge and Skin Changes. Cardiovascular Present- Shortness of Breath. Not Present- Chest Pain, Difficulty Breathing Lying Down, Leg Cramps, Palpitations, Rapid Heart Rate and Swelling of Extremities. Gastrointestinal Present- Abdominal Pain and Bloating. Not Present- Bloody Stool, Change in Bowel Habits, Chronic diarrhea, Constipation, Difficulty Swallowing, Excessive gas, Gets full quickly at meals, Hemorrhoids, Indigestion, Nausea, Rectal Pain and Vomiting. Female Genitourinary Not Present- Frequency, Nocturia, Painful Urination, Pelvic Pain and Urgency. Musculoskeletal Not Present- Back Pain, Joint Pain, Joint Stiffness, Muscle Pain, Muscle Weakness and Swelling of Extremities. Neurological Not Present- Decreased Memory, Fainting, Headaches, Numbness, Seizures, Tingling, Tremor, Trouble walking and Weakness. Psychiatric Not Present- Anxiety, Bipolar, Change in Sleep Pattern, Depression, Fearful and Frequent crying. Endocrine Not Present- Cold Intolerance, Excessive Hunger, Hair Changes, Heat Intolerance, Hot flashes and New Diabetes. Hematology Not Present- Blood Thinners, Easy Bruising, Excessive bleeding, Gland problems, HIV and Persistent Infections. All other systems negative  Vitals Chyrl [place] CMA; February 07, 2021 11:03 AM) February 07, 2021 11:03 AM Pre-Pregnancy Weight: 188.25lb Height: 62in Temp.: 97.55F  Pulse: 94 (Regular)  P.OX: 99% (Room air) BP: 118/64(Sitting, Left Arm, Standard)       Physical Exam Axel Filler MD;  07-Feb-2021 11:15 AM) The physical exam findings are as follows:  Note: Constitutional: No acute distress, conversant, appears stated age  Eyes: Anicteric sclerae, moist conjunctiva, no lid lag  Neck: No thyromegaly, trachea midline, no cervical lymphadenopathy  Lungs: Clear to auscultation biilaterally, normal respiratory effot  Cardiovascular: regular rate & rhythm, no murmurs, no peripheal edema, pedal pulses 2+  GI: Soft, no masses or hepatosplenomegaly, non-tender to palpation  MSK: Normal gait, no clubbing cyanosis, edema  Skin: No rashes, palpation reveals normal skin turgor  Psychiatric: Appropriate judgment and insight, oriented to person, place, and time  Abdomen Inspection Hernias - Ventral - Reducible (Small approximately 2 cm fat-containing) .    Assessment & Plan Axel Filler MD; 01/25/2021 11:16 AM) VENTRAL HERNIA WITHOUT OBSTRUCTION OR GANGRENE (K43.9) Impression: Patient is a 40 year old female, with a primary ventral hernia. 1. The patient will like to proceed to the operating room for laparoscopic ventral hernia repair with mesh.  2. I discussed with the patient the signs and symptoms of incarceration and strangulation and the need to proceed to the ER should they occur.  3. I discussed with the patient the risks and benefits of the procedure to include but not limited to: Infection, bleeding, damage to surrounding structures, possible need for further surgery, possible nerve pain, and possible recurrence. The patient was understanding and wishes to proceed.

## 2021-02-05 ENCOUNTER — Encounter: Payer: Self-pay | Admitting: Physician Assistant

## 2021-02-05 ENCOUNTER — Telehealth: Payer: Medicaid Other | Admitting: Physician Assistant

## 2021-02-05 DIAGNOSIS — J0100 Acute maxillary sinusitis, unspecified: Secondary | ICD-10-CM

## 2021-02-05 DIAGNOSIS — J310 Chronic rhinitis: Secondary | ICD-10-CM

## 2021-02-05 MED ORDER — FLUTICASONE PROPIONATE 50 MCG/ACT NA SUSP: 2.0000 | Freq: Every day | NASAL | 0 refills | Status: DC

## 2021-02-05 MED ORDER — DOXYCYCLINE HYCLATE 100 MG PO TABS: 100.0000 mg | ORAL_TABLET | Freq: Two times a day (BID) | ORAL | 0 refills | Status: DC

## 2021-02-05 MED ORDER — LEVOCETIRIZINE DIHYDROCHLORIDE 5 MG PO TABS: 5.0000 mg | ORAL_TABLET | Freq: Every evening | ORAL | 0 refills | Status: DC

## 2021-02-05 NOTE — Progress Notes (Signed)
Sheena Petty, Sheena Petty are scheduled for a virtual visit with your provider today.    Just as we do with appointments in the office, we must obtain your consent to participate.  Your consent will be active for this visit and any virtual visit you may have with one of our providers in the next 365 days.    If you have a MyChart account, I can also send a copy of this consent to you electronically.  All virtual visits are billed to your insurance company just like a traditional visit in the office.  As this is a virtual visit, video technology does not allow for your provider to perform a traditional examination.  This may limit your provider's ability to fully assess your condition.  If your provider identifies any concerns that need to be evaluated in person or the need to arrange testing such as labs, EKG, etc, we will make arrangements to do so.    Although advances in technology are sophisticated, we cannot ensure that it will always work on either your end or our end.  If the connection with a video visit is poor, we may have to switch to a telephone visit.  With either a video or telephone visit, we are not always able to ensure that we have a secure connection.   I need to obtain your verbal consent now.   Are you willing to proceed with your visit today?   Sheena Petty has provided verbal consent on 02/05/2021 for a virtual visit (video or telephone).  Piedad Climes, PA-C 02/05/2021  9:01 AM   Virtual Visit via Video   I connected with patient on 02/05/21 at  8:45 AM EDT by a video enabled telemedicine application and verified that I am speaking with the correct person using two identifiers.  Location patient: Home Location provider: [facility] Persons participating in the virtual visit: Patient, Provider  I discussed the limitations of evaluation and management by telemedicine and the availability of in person appointments. The patient expressed understanding and agreed  to proceed.  Subjective:   HPI:   Patient presents via [facility] today noting significant issue with allergy symptoms since season change including rhinorrhea, sneezing, watery eyes, nasal congestion and PND. Notes she will have puffy eyes after being outside. Notes now with bilateral maxillary sinus pain and sinus headache. Denies fever, chills, recent travel or sick contact. Occasional dry cough. Has taken Sudafed but not helping much.    ROS:   See pertinent positives and negatives per HPI.  There are no problems to display for this patient.   Social History   Tobacco Use  . Smoking status: Current Some Day Smoker    Packs/day: 0.30    Types: Cigarettes  . Smokeless tobacco: Never Used  . Tobacco comment: 6 per day  Substance Use Topics  . Alcohol use: Yes    Current Outpatient Medications:  .  doxycycline (VIBRA-TABS) 100 MG tablet, Take 1 tablet (100 mg total) by mouth 2 (two) times daily., Disp: 20 tablet, Rfl: 0 .  fluticasone (FLONASE) 50 MCG/ACT nasal spray, Place 2 sprays into both nostrils daily., Disp: 16 g, Rfl: 0 .  levocetirizine (XYZAL) 5 MG tablet, Take 1 tablet (5 mg total) by mouth every evening., Disp: 30 tablet, Rfl: 0  Allergies  Allergen Reactions  . Other     Cherry fruit    Objective:   There were no vitals taken for this visit.  Patient is well-developed, well-nourished  in no acute distress.  Resting comfortably at home.  Head is normocephalic, atraumatic.  No labored breathing.  Speech is clear and coherent with logical content.  Patient is alert and oriented at baseline.   Assessment and Plan:   1. Acute non-recurrent maxillary sinusitis Rx Doxycycline.  Increase fluids.  Rest.  Saline nasal spray.  Probiotic.  Mucinex as directed.  Humidifier in bedroom. Start Flonase.  Call or return to clinic if symptoms are not improving.  - doxycycline (VIBRA-TABS) 100 MG tablet; Take 1 tablet (100 mg total) by mouth 2 (two) times daily.   Dispense: 20 tablet; Refill: 0 - fluticasone (FLONASE) 50 MCG/ACT nasal spray; Place 2 sprays into both nostrils daily.  Dispense: 16 g; Refill: 0  2. Chronic rhinitis Uncontrolled and untreated. Will have her start combination of Xyzal and FLonase. OTC medications and supportive measures reviewed.  - levocetirizine (XYZAL) 5 MG tablet; Take 1 tablet (5 mg total) by mouth every evening.  Dispense: 30 tablet; Refill: 0 - fluticasone (FLONASE) 50 MCG/ACT nasal spray; Place 2 sprays into both nostrils daily.  Dispense: 16 g; Refill: 0  Patient to follow-up with PCP for any non-resolving symptoms.    Piedad Climes, [place] 02/05/2021

## 2021-02-05 NOTE — Patient Instructions (Signed)
Instructions sent to patients MyChart.

## 2021-03-28 ENCOUNTER — Other Ambulatory Visit: Payer: Self-pay

## 2021-03-28 ENCOUNTER — Encounter (HOSPITAL_COMMUNITY): Payer: Self-pay | Admitting: Emergency Medicine

## 2021-03-28 ENCOUNTER — Ambulatory Visit (HOSPITAL_COMMUNITY)
Admission: EM | Admit: 2021-03-28 | Discharge: 2021-03-28 | Disposition: A | Discharge status: Home / Self Care | Payer: Self-pay | Attending: Physician Assistant | Admitting: Physician Assistant

## 2021-03-28 DIAGNOSIS — Z87891 Personal history of nicotine dependence: Secondary | ICD-10-CM | POA: Insufficient documentation

## 2021-03-28 DIAGNOSIS — J029 Acute pharyngitis, unspecified: Secondary | ICD-10-CM | POA: Insufficient documentation

## 2021-03-28 DIAGNOSIS — J069 Acute upper respiratory infection, unspecified: Secondary | ICD-10-CM

## 2021-03-28 DIAGNOSIS — R0602 Shortness of breath: Secondary | ICD-10-CM | POA: Insufficient documentation

## 2021-03-28 DIAGNOSIS — U071 COVID-19: Secondary | ICD-10-CM | POA: Insufficient documentation

## 2021-03-28 DIAGNOSIS — R059 Cough, unspecified: Secondary | ICD-10-CM | POA: Insufficient documentation

## 2021-03-28 MED ORDER — PROMETHAZINE-DM 6.25-15 MG/5ML PO SYRP
5.0000 mL | ORAL_SOLUTION | Freq: Three times a day (TID) | ORAL | 0 refills | Status: DC | PRN
Start: 2021-03-28 — End: 2023-10-08

## 2021-03-28 MED ORDER — NAPROXEN 375 MG PO TABS
375.0000 mg | ORAL_TABLET | Freq: Two times a day (BID) | ORAL | 0 refills | Status: DC
Start: 2021-03-28 — End: 2023-10-08

## 2021-03-28 NOTE — ED Provider Notes (Signed)
[facility]    CSN: [id number] Arrival date & time: 03/28/21  1953      History   Chief Complaint Chief Complaint  Patient presents with  . Cough    HPI Sheena STEADMAN is a 40 y.o. female.   Patient presents today with a several day history of fever.  Reports associated cough, body aches, headache, shortness of breath, sore throat.  Denies chest pain, nausea, vomiting.  She has tried over-the-counter medications without improvement.  She denies any known sick contacts.  Has not had flu or COVID-19 vaccination.  Denies history of asthma.  She does have a history of seasonal allergies and has been taking medication as prescribed.  Denies any recent antibiotic use.  She has not missed work as result of symptoms.     History reviewed. No pertinent past medical history.  There are no problems to display for this patient.   Past Surgical History:  Procedure Laterality Date  . CESAREAN SECTION    . TUBAL LIGATION      OB History   No obstetric history on file.      Home Medications    Prior to Admission medications   Medication Sig Start Date End Date Taking? Authorizing Provider  naproxen (NAPROSYN) 375 MG tablet Take 1 tablet (375 mg total) by mouth 2 (two) times daily. 03/28/21  Yes Chara Marquard K, PA-C  promethazine-dextromethorphan (PROMETHAZINE-DM) 6.25-15 MG/5ML syrup Take 5 mLs by mouth 3 (three) times daily as needed for cough. 03/28/21  Yes Doyne Ellinger K, PA-C  fluticasone (FLONASE) 50 MCG/ACT nasal spray Place 2 sprays into both nostrils daily. Patient not taking: Reported on 03/28/2021 02/05/21 03/28/21  Waldon Merl, PA-C  levocetirizine (XYZAL) 5 MG tablet Take 1 tablet (5 mg total) by mouth every evening. Patient not taking: Reported on 03/28/2021 02/05/21 03/28/21  Waldon Merl, PA-C    Family History Family History  Problem Relation Age of Onset  . Asthma Mother   . Hypertension Mother   . Healthy Father     Social History Social  History   Tobacco Use  . Smoking status: Former Smoker    Packs/day: 0.30    Types: Cigarettes  . Smokeless tobacco: Never Used  . Tobacco comment: 6 per day  Vaping Use  . Vaping Use: Never used  Substance Use Topics  . Alcohol use: Yes  . Drug use: No     Allergies   Other   Review of Systems Review of Systems  Constitutional: Positive for activity change, fatigue and fever. Negative for appetite change.  HENT: Positive for congestion and sore throat. Negative for sinus pressure and sneezing.   Respiratory: Positive for cough and shortness of breath.   Cardiovascular: Negative for chest pain.  Gastrointestinal: Negative for abdominal pain, diarrhea, nausea and vomiting.  Musculoskeletal: Positive for arthralgias and myalgias.  Neurological: Positive for headaches. Negative for dizziness and light-headedness.     Physical Exam Triage Vital Signs ED Triage Vitals  Enc Vitals Group     BP 03/28/21 2024 (!) 142/93     Pulse Rate 03/28/21 2024 (!) 107     Resp 03/28/21 2024 20     Temp 03/28/21 2024 (!) 100.8 F (38.2 C)     Temp src --      SpO2 03/28/21 2024 100 %     Weight --      Height --      Head Circumference --      Peak  Flow --      Pain Score 03/28/21 2021 7     Pain Loc --      Pain Edu? --      Excl. in GC? --    No data found.  Updated Vital Signs BP (!) 142/93 (BP Location: Left Arm)   Pulse (!) 107   Temp (!) 100.8 F (38.2 C)   Resp 20   LMP 03/08/2021   SpO2 100%   Visual Acuity Right Eye Distance:   Left Eye Distance:   Bilateral Distance:    Right Eye Near:   Left Eye Near:    Bilateral Near:     Physical Exam Vitals reviewed.  Constitutional:      General: She is awake. She is not in acute distress.    Appearance: Normal appearance. She is underweight. She is not ill-appearing.     Comments: Very pleasant female appears stated age in no acute distress  HENT:     Head: Normocephalic and atraumatic.     Right Ear:  Tympanic membrane, ear canal and external ear normal. Tympanic membrane is not erythematous or bulging.     Left Ear: Tympanic membrane, ear canal and external ear normal. Tympanic membrane is not erythematous or bulging.     Nose:     Right Sinus: No maxillary sinus tenderness or frontal sinus tenderness.     Left Sinus: No maxillary sinus tenderness or frontal sinus tenderness.     Mouth/Throat:     Pharynx: Uvula midline. No oropharyngeal exudate or posterior oropharyngeal erythema.  Eyes:     Pupils: Pupils are equal, round, and reactive to light.  Cardiovascular:     Rate and Rhythm: Normal rate and regular rhythm.     Heart sounds: Normal heart sounds. No murmur heard.   Pulmonary:     Effort: Pulmonary effort is normal.     Breath sounds: Normal breath sounds. No wheezing, rhonchi or rales.     Comments: Clear to auscultation bilaterally Abdominal:     General: Bowel sounds are normal.     Palpations: Abdomen is soft.     Tenderness: There is no abdominal tenderness.  Musculoskeletal:     Cervical back: Normal range of motion and neck supple.  Lymphadenopathy:     Head:     Right side of head: No submental, submandibular or tonsillar adenopathy.     Left side of head: No submental, submandibular or tonsillar adenopathy.     Cervical: No cervical adenopathy.  Psychiatric:        Behavior: Behavior is cooperative.      UC Treatments / Results  Labs (all labs ordered are listed, but only abnormal results are displayed) Labs Reviewed  SARS CORONAVIRUS 2 (TAT 6-24 HRS)    EKG   Radiology No results found.  Procedures Procedures (including critical care time)  Medications Ordered in UC Medications - No data to display  Initial Impression / Assessment and Plan / UC Course  I have reviewed the triage vital signs and the nursing notes.  Pertinent labs & imaging results that were available during my care of the patient were reviewed by me and considered in my  medical decision making (see chart for details).     Likely viral etiology given short duration of symptoms.  Unfortunately, we do not have flu tests available.  Patient was tested for COVID-19 and instructed to remain in isolation until results are obtained.  She was prescribed Naprosyn to be used  for body aches and headache with instruction not to take additional NSAIDs.  She was prescribed promethazine DM with instructed not to drink alcohol with this medication.  Strict return precautions given to which patient expressed understanding.  Final Clinical Impressions(s) / UC Diagnoses   Final diagnoses:  Viral URI with cough  Cough     Discharge Instructions     We will be in touch with your COVID results.  Please take Naprosyn twice daily as needed for head and body pain.  You should not take NSAIDs with this including aspirin, ibuprofen/Advil, naproxen/Aleve.  Take Promethazine DM up to 3 times a day as needed for cough.  This can make you sleepy so do not drive or drink alcohol with it.  If you have any worsening symptoms please return for reevaluation.    ED Prescriptions    Medication Sig Dispense Auth. Provider   promethazine-dextromethorphan (PROMETHAZINE-DM) 6.25-15 MG/5ML syrup Take 5 mLs by mouth 3 (three) times daily as needed for cough. 118 mL Jakyron Fabro K, PA-C   naproxen (NAPROSYN) 375 MG tablet Take 1 tablet (375 mg total) by mouth 2 (two) times daily. 20 tablet Keina Mutch, Noberto Retort, PA-C     PDMP not reviewed this encounter.   Jeani Hawking, Cordelia Poche 03/28/21 2119

## 2021-03-28 NOTE — ED Triage Notes (Signed)
Coughing, chills, sob, headache, feels feverish

## 2021-03-28 NOTE — Discharge Instructions (Addendum)
We will be in touch with your COVID results.  Please take Naprosyn twice daily as needed for head and body pain.  You should not take NSAIDs with this including aspirin, ibuprofen/Advil, naproxen/Aleve.  Take Promethazine DM up to 3 times a day as needed for cough.  This can make you sleepy so do not drive or drink alcohol with it.  If you have any worsening symptoms please return for reevaluation.

## 2021-03-29 LAB — SARS CORONAVIRUS 2 (TAT 6-24 HRS): SARS Coronavirus 2: POSITIVE — AB

## 2021-04-12 ENCOUNTER — Ambulatory Visit (INDEPENDENT_AMBULATORY_CARE_PROVIDER_SITE_OTHER): Payer: 59

## 2021-04-12 ENCOUNTER — Ambulatory Visit (HOSPITAL_COMMUNITY)
Admission: EM | Admit: 2021-04-12 | Discharge: 2021-04-12 | Disposition: A | Discharge status: Home / Self Care | Payer: 59 | Attending: Internal Medicine | Admitting: Internal Medicine

## 2021-04-12 ENCOUNTER — Encounter (HOSPITAL_COMMUNITY): Payer: Self-pay | Admitting: Emergency Medicine

## 2021-04-12 ENCOUNTER — Other Ambulatory Visit: Payer: Self-pay

## 2021-04-12 DIAGNOSIS — R079 Chest pain, unspecified: Secondary | ICD-10-CM | POA: Insufficient documentation

## 2021-04-12 DIAGNOSIS — R0602 Shortness of breath: Secondary | ICD-10-CM | POA: Diagnosis present

## 2021-04-12 DIAGNOSIS — R059 Cough, unspecified: Secondary | ICD-10-CM | POA: Diagnosis present

## 2021-04-12 LAB — D-DIMER, QUANTITATIVE: D-Dimer, Quant: <0.27 ug/mL-FEU (ref 0.00–0.50)

## 2021-04-12 MED ORDER — METHYLPREDNISOLONE SODIUM SUCC 125 MG IJ SOLR
125.0000 mg | Freq: Once | INTRAMUSCULAR | Status: AC
  Administered 2021-04-12: 125 mg via INTRAMUSCULAR

## 2021-04-12 MED ORDER — ALBUTEROL SULFATE HFA 108 (90 BASE) MCG/ACT IN AERS: 1.0000 | INHALATION_SPRAY | Freq: Four times a day (QID) | RESPIRATORY_TRACT | 0 refills | Status: DC | PRN

## 2021-04-12 MED ORDER — METHYLPREDNISOLONE SODIUM SUCC 125 MG IJ SOLR
INTRAMUSCULAR | Status: AC
  Filled 2021-04-12: qty 2

## 2021-04-12 NOTE — Discharge Instructions (Addendum)
Should symptoms worsen please go to the ER

## 2021-04-12 NOTE — ED Notes (Signed)
Okay per Fortino Sic, PA for pt to be evaluated at [facility]

## 2021-04-12 NOTE — ED Triage Notes (Signed)
Pt presents with tightness in chest, SOB, productive cough. States last night felt like she was suffocating when lying down. Tested positive for COVID on 03/28/21

## 2021-04-12 NOTE — ED Provider Notes (Signed)
[facility]    CSN: [id number] Arrival date & time: 04/12/21  9024      History   Chief Complaint Chief Complaint  Patient presents with   Chest Pain   Shortness of Breath   Cough    Productive    HPI Sheena Petty is a 40 y.o. female.   HPI  Chest Pain and SOB: Patient reports with chest pain and shortness of breath.  Cough is present and productive at times without hemoptysis. She was diagnosed with COVID on 03/28/2021.  She states symptoms have been present since diagnoses but have worsened slightly over the past few days. She reports that chest pain mainly occurs when she takes a deep breath, SOB occurs with activity such as when she returned to work from illness. She denies calf pain, hormone use, recent surgeries/procedures/long flights/car rides. She is unsure of family history of blood clots.   History reviewed. No pertinent past medical history.  There are no problems to display for this patient.   Past Surgical History:  Procedure Laterality Date   CESAREAN SECTION     TUBAL LIGATION      OB History   No obstetric history on file.      Home Medications    Prior to Admission medications   Medication Sig Start Date End Date Taking? Authorizing Provider  naproxen (NAPROSYN) 375 MG tablet Take 1 tablet (375 mg total) by mouth 2 (two) times daily. 03/28/21   Raspet, Noberto Retort, PA-C  promethazine-dextromethorphan (PROMETHAZINE-DM) 6.25-15 MG/5ML syrup Take 5 mLs by mouth 3 (three) times daily as needed for cough. 03/28/21   Raspet, Denny Peon K, PA-C  fluticasone (FLONASE) 50 MCG/ACT nasal spray Place 2 sprays into both nostrils daily. Patient not taking: Reported on 03/28/2021 02/05/21 03/28/21  Waldon Merl, PA-C  levocetirizine (XYZAL) 5 MG tablet Take 1 tablet (5 mg total) by mouth every evening. Patient not taking: Reported on 03/28/2021 02/05/21 03/28/21  Waldon Merl, PA-C    Family History Family History  Problem Relation Age of Onset   Asthma  Mother    Hypertension Mother    Healthy Father     Social History Social History   Tobacco Use   Smoking status: Former    Packs/day: 0.30    Pack years: 0.00    Types: Cigarettes   Smokeless tobacco: Never   Tobacco comments:    6 per day  Vaping Use   Vaping Use: Never used  Substance Use Topics   Alcohol use: Yes   Drug use: No     Allergies   Other   Review of Systems Review of Systems  As stated above in HPI Physical Exam Triage Vital Signs ED Triage Vitals  Enc Vitals Group     BP 04/12/21 1007 129/89     Pulse Rate 04/12/21 1007 79     Resp 04/12/21 1007 18     Temp 04/12/21 1007 97.8 F (36.6 C)     Temp Source 04/12/21 1007 Oral     SpO2 04/12/21 1007 100 %     Weight --      Height --      Head Circumference --      Peak Flow --      Pain Score 04/12/21 1004 7     Pain Loc --      Pain Edu? --      Excl. in GC? --    No data found.  Updated Vital Signs  BP 129/89 (BP Location: Right Arm)   Pulse 79   Temp 97.8 F (36.6 C) (Oral)   Resp 18   LMP 04/03/2021   SpO2 100%   Physical Exam Vitals and nursing note reviewed.  Constitutional:      General: She is not in acute distress.    Appearance: She is well-developed. She is not ill-appearing, toxic-appearing or diaphoretic.  HENT:     Head: Normocephalic and atraumatic.  Eyes:     Extraocular Movements: Extraocular movements intact.     Pupils: Pupils are equal, round, and reactive to light.  Neck:     Thyroid: No thyromegaly.  Cardiovascular:     Rate and Rhythm: Normal rate and regular rhythm.     Heart sounds: Normal heart sounds.  Pulmonary:     Effort: Pulmonary effort is normal.     Breath sounds: Normal breath sounds.  Chest:     Chest wall: No mass or tenderness.  Musculoskeletal:     Cervical back: Normal range of motion and neck supple.  Lymphadenopathy:     Cervical: No cervical adenopathy.  Skin:    General: Skin is warm.     Coloration: Skin is not cyanotic.   Neurological:     General: No focal deficit present.     Mental Status: She is alert and oriented to person, place, and time.     UC Treatments / Results  Labs (all labs ordered are listed, but only abnormal results are displayed) Labs Reviewed - No data to display  EKG   Radiology DG Chest 2 View  Result Date: 04/12/2021 CLINICAL DATA:  Chest pain and shortness of breath EXAM: CHEST - 2 VIEW COMPARISON:  June 11, 2008 FINDINGS: Lungs are clear. Heart size and pulmonary vascularity are normal. No adenopathy. No pneumothorax no lesions. IMPRESSION: Lungs clear.  Cardiac silhouette normal. Electronically Signed   By: Bretta Bang III M.D.   On: 04/12/2021 11:07    Procedures Procedures (including critical care time)  Medications Ordered in UC Medications - No data to display  Initial Impression / Assessment and Plan / UC Course  I have reviewed the triage vital signs and the nursing notes.  Pertinent labs & imaging results that were available during my care of the patient were reviewed by me and considered in my medical decision making (see chart for details).    New.  EKG actually appears a bit better than her previous.  Chest x-ray is benign appearing.  Her vital signs and clinical history are fairly reassuring however given her recent COVID history and unknown family history of clotting disease we are going to obtain a D-dimer to help rule out blood clot.  She is aware that if her D-dimer is elevated she will need to go to the hospital for further work-up to rule out a pulmonary embolism.  In the meantime we are going to treat with Solu-Medrol and albuterol as this likely appears to be some bronchitis secondary to her COVID-19 diagnosis.  I will also send in Tessalon and flonase.  Discussed red flag signs and symptoms. Final Clinical Impressions(s) / UC Diagnoses   Final diagnoses:  None   Discharge Instructions   None    ED Prescriptions   None    PDMP not  reviewed this encounter.   Rushie Chestnut, [place] 04/12/21 1126

## 2021-04-23 ENCOUNTER — Ambulatory Visit (HOSPITAL_COMMUNITY)
Admission: EM | Admit: 2021-04-23 | Discharge: 2021-04-23 | Disposition: A | Discharge status: Home / Self Care | Payer: 59 | Attending: Urgent Care | Admitting: Urgent Care

## 2021-04-23 ENCOUNTER — Emergency Department (HOSPITAL_COMMUNITY): Admission: EM | Admit: 2021-04-23 | Discharge: 2021-04-23 | Discharge status: Against Medical Advice | Payer: 59

## 2021-04-23 ENCOUNTER — Other Ambulatory Visit: Payer: Self-pay

## 2021-04-23 ENCOUNTER — Encounter (HOSPITAL_COMMUNITY): Payer: Self-pay

## 2021-04-23 DIAGNOSIS — H5711 Ocular pain, right eye: Secondary | ICD-10-CM

## 2021-04-23 DIAGNOSIS — H579 Unspecified disorder of eye and adnexa: Secondary | ICD-10-CM

## 2021-04-23 MED ORDER — AZELASTINE HCL 0.05 % OP SOLN: 1.0000 [drp] | Freq: Two times a day (BID) | OPHTHALMIC | 0 refills | Status: DC

## 2021-04-23 MED ORDER — POLYMYXIN B-TRIMETHOPRIM 10000-0.1 UNIT/ML-% OP SOLN: 1.0000 [drp] | OPHTHALMIC | 0 refills | Status: DC

## 2021-04-23 MED ORDER — DICLOFENAC SODIUM 0.1 % OP SOLN: 1.0000 [drp] | Freq: Four times a day (QID) | OPHTHALMIC | 0 refills | Status: DC

## 2021-04-23 NOTE — Discharge Instructions (Addendum)
Please contact Dr. Gerhard Perches office today for an urgent appointment, consultation. I am concerned that you have adhesive or a corneal scratch on your right eye from the eye lashes you had placed yesterday. In the meantime, use azelastine eye drops for itching, diclofenac for pain and Polytrim for infection.

## 2021-04-23 NOTE — ED Triage Notes (Signed)
Pt presents with right eye irritation & pain after getting her lashes done for the first time yesterday.

## 2021-04-23 NOTE — ED Notes (Signed)
Pt inquired of wait time. This RN informed her I cannot give her an exact wait time but that 30 patients have been waiting here longer than her. Pt leaving to go to urgent care.

## 2021-04-23 NOTE — ED Provider Notes (Signed)
[name]   MRN: [id number] DOB: [birth year]  Subjective:   Sheena Petty is a 40 y.o. female presenting for 1 day history of acute onset worsening right eye pain, irritation, redness, watering and foreign body sensation, photophobia.  Symptoms started after patient had fake eyelashes placed.  Feels like an eyelash or the glue is stuck in the underside of her right upper eyelid.  Has not used any medications for relief.  No current facility-administered medications for this encounter.  Current Outpatient Medications:    albuterol (VENTOLIN HFA) 108 (90 Base) MCG/ACT inhaler, Inhale 1-2 puffs into the lungs every 6 (six) hours as needed for wheezing or shortness of breath., Disp: 1 each, Rfl: 0   naproxen (NAPROSYN) 375 MG tablet, Take 1 tablet (375 mg total) by mouth 2 (two) times daily., Disp: 20 tablet, Rfl: 0   promethazine-dextromethorphan (PROMETHAZINE-DM) 6.25-15 MG/5ML syrup, Take 5 mLs by mouth 3 (three) times daily as needed for cough., Disp: 118 mL, Rfl: 0   Allergies  Allergen Reactions   Other     Cherry fruit    History reviewed. No pertinent past medical history.   Past Surgical History:  Procedure Laterality Date   CESAREAN SECTION     TUBAL LIGATION      Family History  Problem Relation Age of Onset   Asthma Mother    Hypertension Mother    Healthy Father     Social History   Tobacco Use   Smoking status: Former    Packs/day: 0.30    Pack years: 0.00    Types: Cigarettes   Smokeless tobacco: Never   Tobacco comments:    6 per day  Vaping Use   Vaping Use: Never used  Substance Use Topics   Alcohol use: Yes   Drug use: No    ROS   Objective:   Vitals: BP (!) 124/97 (BP Location: Right Arm)   Pulse 99   Temp 98.5 F (36.9 C) (Oral)   Resp 18   LMP 04/03/2021   SpO2 100%   Physical Exam Constitutional:      General: She is not in acute distress.    Appearance: Normal appearance. She is well-developed. She is  not ill-appearing, toxic-appearing or diaphoretic.  HENT:     Head: Normocephalic and atraumatic.     Nose: Nose normal.     Mouth/Throat:     Mouth: Mucous membranes are moist.     Pharynx: Oropharynx is clear.  Eyes:     General: Lids are everted, no foreign bodies appreciated. No scleral icterus.       Right eye: Discharge (Clear and watery) present. No foreign body or hordeolum.        Left eye: No foreign body, discharge or hordeolum.     Extraocular Movements: Extraocular movements intact.     Conjunctiva/sclera:     Right eye: Right conjunctiva is injected. No chemosis, exudate or hemorrhage.    Left eye: Left conjunctiva is not injected. No chemosis, exudate or hemorrhage.    Pupils: Pupils are equal, round, and reactive to light.   Cardiovascular:     Rate and Rhythm: Normal rate.  Pulmonary:     Effort: Pulmonary effort is normal.  Skin:    General: Skin is warm and dry.  Neurological:     General: No focal deficit present.     Mental Status: She is alert and oriented to person, place, and time.  Psychiatric:  Mood and Affect: Mood normal.        Behavior: Behavior normal.    Eye Exam: Eyelids everted and swept for foreign body. The eye was anesthetized with 2 drops of tetracaine and stained with fluorescein. Examination under woods lamp does reveal an area of increased stain uptake as outlined on the diagram. The eye was then irrigated copiously with saline.   Assessment and Plan :   PDMP not reviewed this encounter.  1. Pain of right eye   2. Sensation of foreign body in eye     Discussed with patient the possibility that this is adhesive from the fake eyelashes or a corneal scratch.  Recommended Polytrim to address infectious process, diclofenac for pain, azelastine for itching.  Emphasized need for follow-up with the ophthalmologist on-call, Dr. Jenene Slicker.  Provided her with this information and patient agrees to contact their office for emergent consultation.  Counseled patient on potential for adverse effects with medications prescribed/recommended today, ER and return-to-clinic precautions discussed, patient verbalized understanding.    Wallis Bamberg, [place] 04/23/21 [phone]

## 2021-04-23 NOTE — ED Triage Notes (Signed)
Pt reports waking up with something in her eye. Had eyelashes done for the first time this weekend.

## 2021-05-17 IMAGING — CT CT ABD-PELV W/ CM
2 of 4 series · 15 of 46 positions shown, 17 images · IV contrast (omnipaque)
Comparison: No priors.

CLINICAL DATA: 38-year-old female with history of blood in the
stool. Acute abdominal pain generalized for the past 2 days with
blood in stool and nausea.

EXAM:
CT ABDOMEN AND PELVIS WITH CONTRAST
TECHNIQUE: Multidetector CT imaging of the abdomen and pelvis was performed
using the standard protocol following bolus administration of
intravenous contrast.
CONTRAST:  100mL OMNIPAQUE IOHEXOL 300 MG/ML  SOLN

[Series 2: axial st · axial · 0.90mm/px · z∈[-777,-327]mm · 12 of 102 slices shown, 14 images]
[im 6/102  soft-tissue]
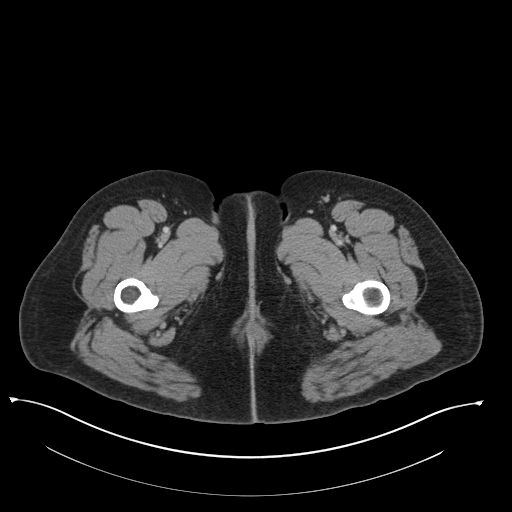
[im 6/102  bone]
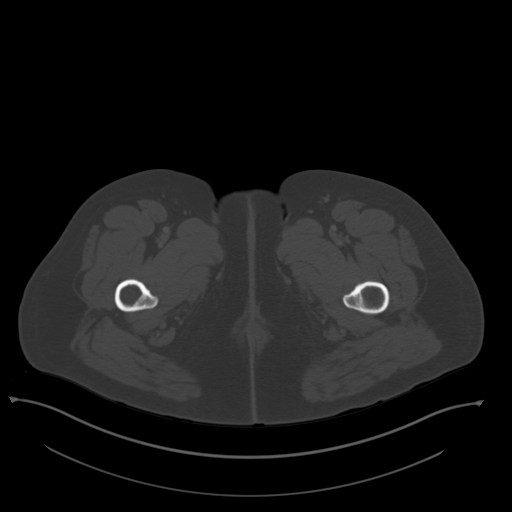
[im 18/102  soft-tissue]
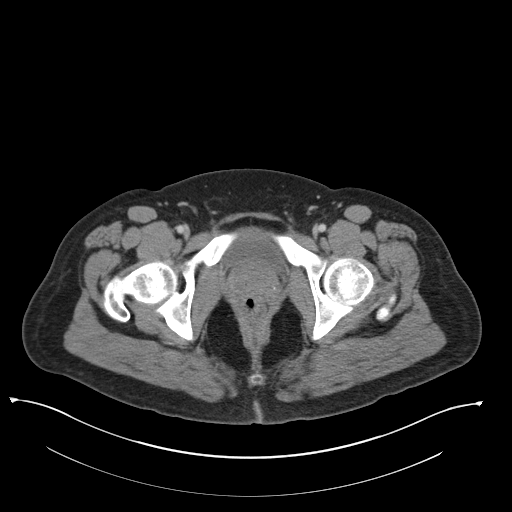
[im 24/102  soft-tissue]
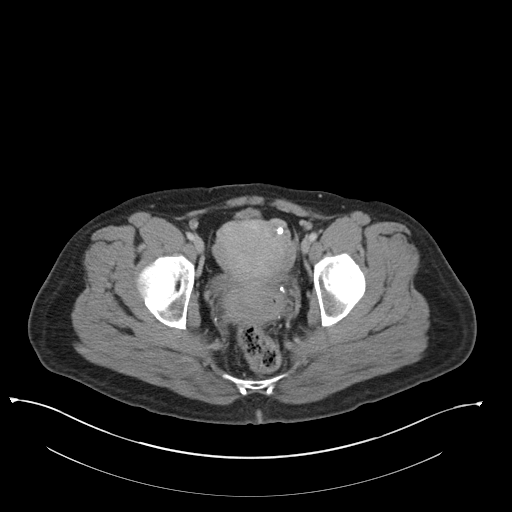
[im 30/102  soft-tissue]
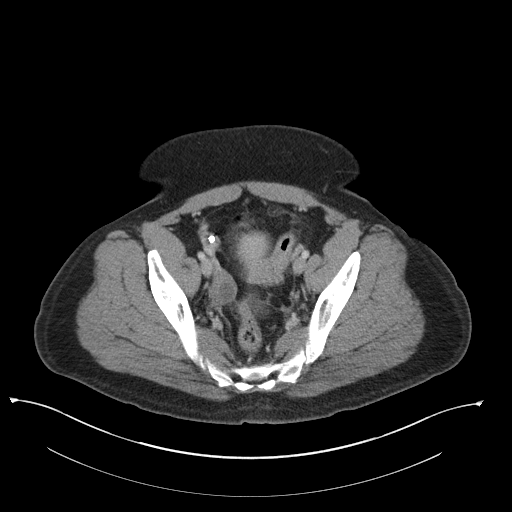
[im 42/102  soft-tissue]
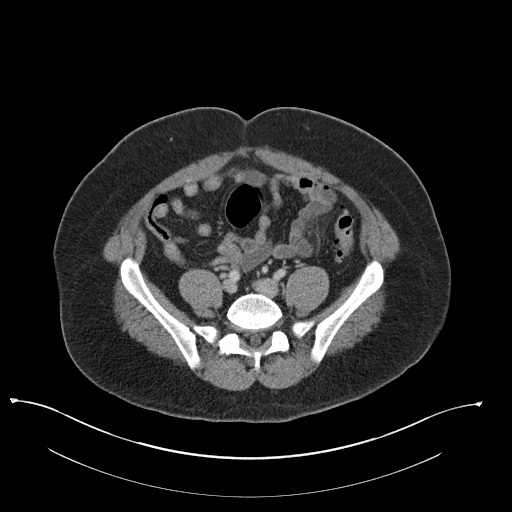
[im 48/102  soft-tissue]
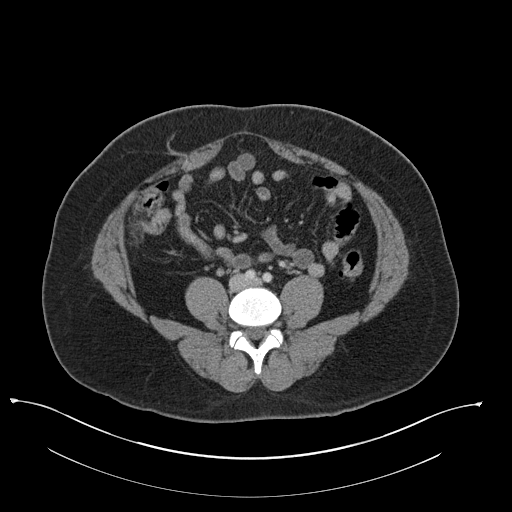
[im 54/102  soft-tissue]
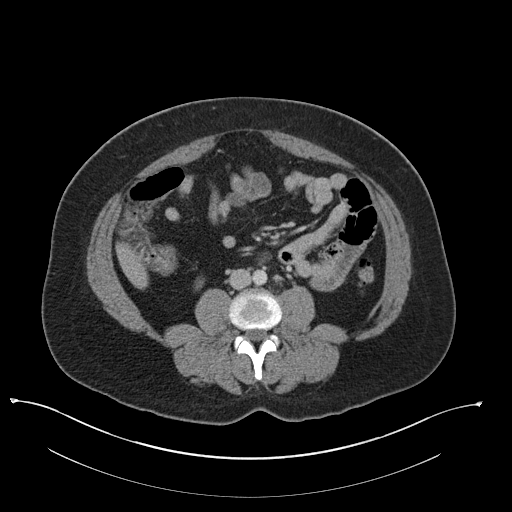
[im 66/102  soft-tissue]
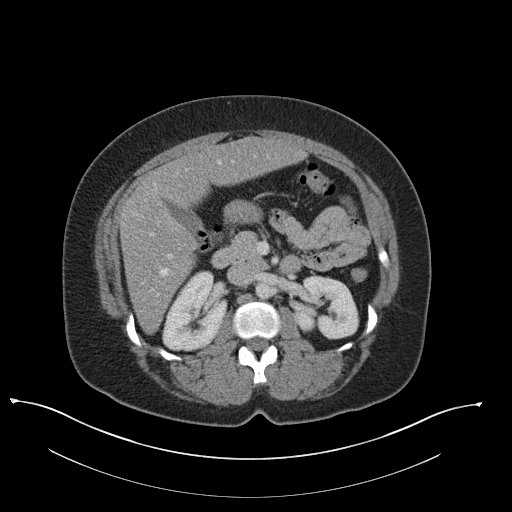
[im 72/102  soft-tissue]
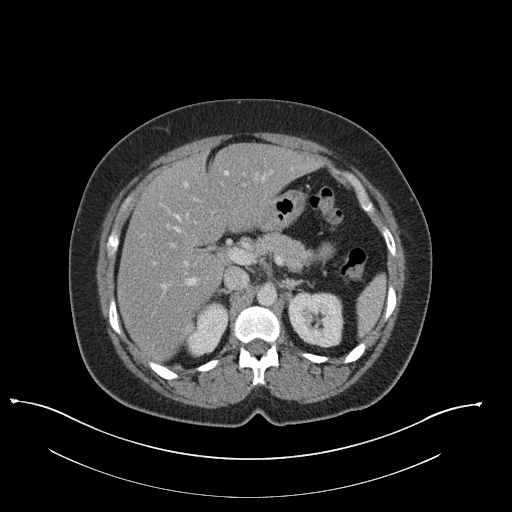
[im 72/102  bone]
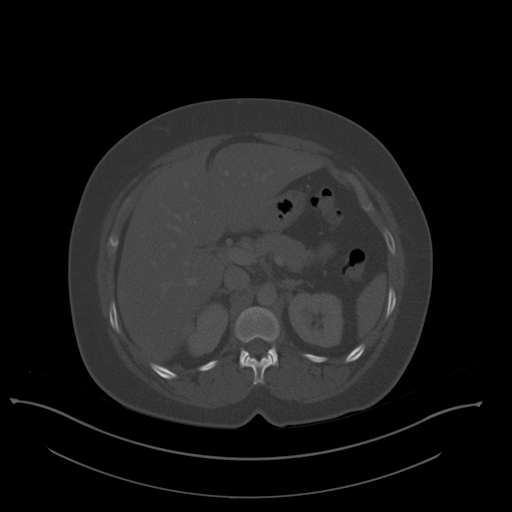
[im 78/102  soft-tissue]
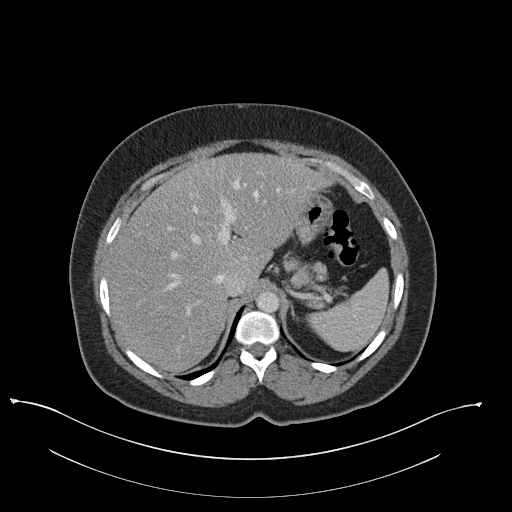
[im 90/102  soft-tissue]
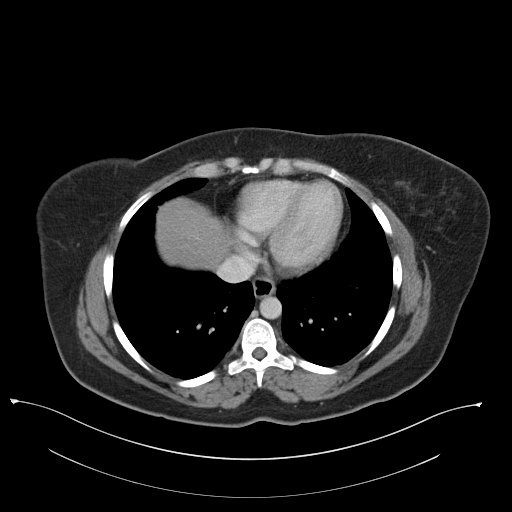
[im 96/102  soft-tissue]
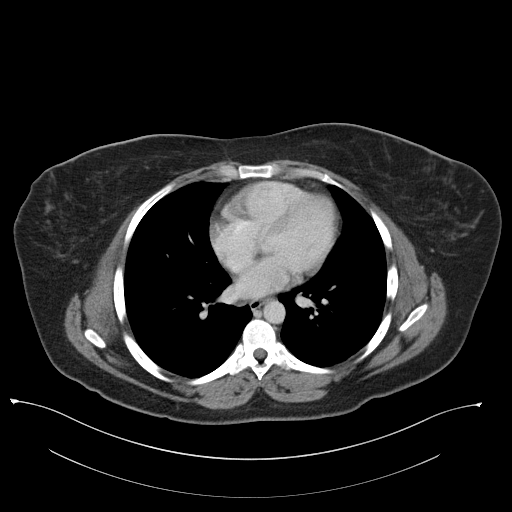

[Series 4: coronal st · coronal · 0.74mm/px · 3 of 151 slices shown]
[im 51/151  soft-tissue]
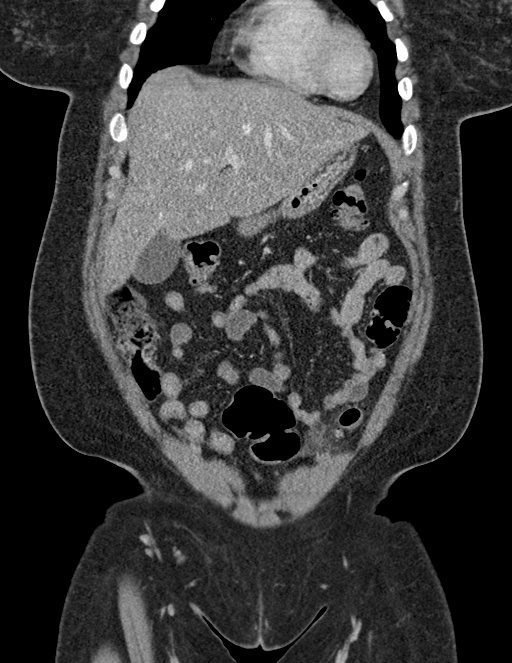
[im 67/151  soft-tissue]
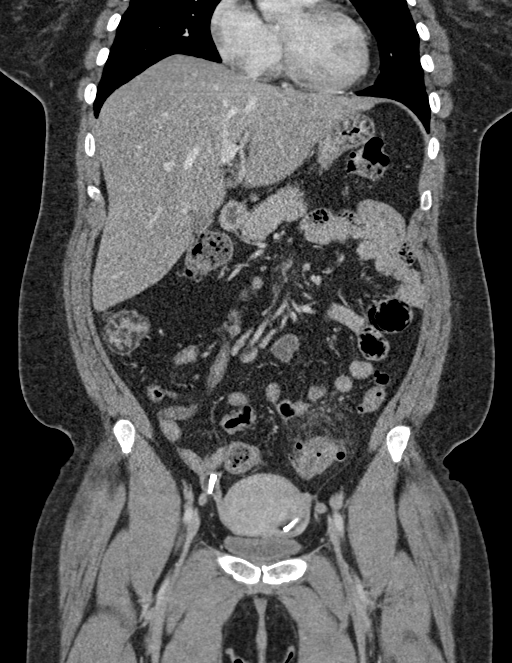
[im 84/151  soft-tissue]
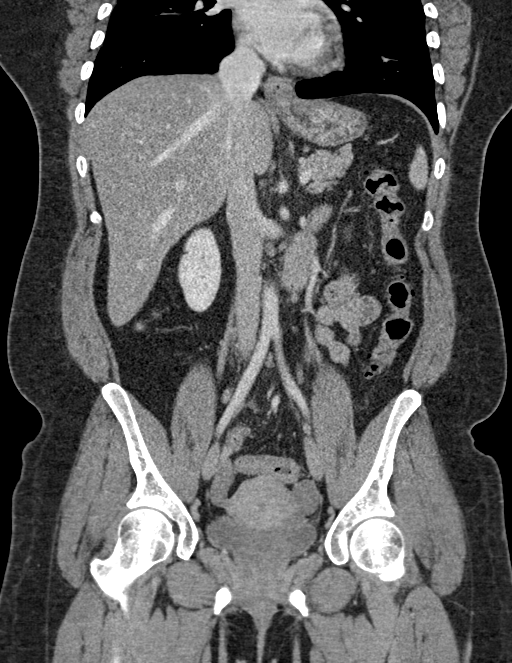

[15 of 46 positions shown; findings below may reference images not displayed]

FINDINGS: Lower chest: Small hiatal hernia.

Hepatobiliary: No suspicious cystic or solid hepatic lesions. No
intra or extrahepatic biliary ductal dilatation. Gallbladder is
normal in appearance.

Pancreas: No pancreatic mass. No pancreatic ductal dilatation. No
pancreatic or peripancreatic fluid collections or inflammatory
changes.

Spleen: Unremarkable.

Adrenals/Urinary Tract: 1.2 cm low-attenuation lesion in the lateral
aspect of the interpolar region of the left kidney, compatible with
a simple cyst. Right kidney and bilateral adrenal glands are normal
in appearance. No hydroureteronephrosis. Urinary bladder is normal
in appearance.

Stomach/Bowel: Normal appearance of the stomach. No pathologic
dilatation of small bowel or colon. Numerous colonic diverticulae
are noted, particularly in the sigmoid colon where there are
extensive areas of mural thickening and surrounding inflammatory
changes associated with the proximal sigmoid colon, compatible with
an acute diverticulitis. No discrete diverticular abscess
confidently identified at this time. No definite signs of frank
perforation. Normal appendix.

Vascular/Lymphatic: No significant atherosclerotic disease, aneurysm
or dissection noted in the abdominal or pelvic vasculature. No
lymphadenopathy noted in the abdomen or pelvis.

Reproductive: Bilateral tubal ligation clips are noted. Uterus and
ovaries are unremarkable in appearance.

Other: No significant volume of ascites.  No pneumoperitoneum.

Musculoskeletal: There are no aggressive appearing lytic or blastic
lesions noted in the visualized portions of the skeleton.
IMPRESSION: 1. Severe acute diverticulitis in the proximal sigmoid colon,
without discrete diverticular abscess or definite signs of frank
perforation at this time.
2. Small hiatal hernia.

## 2021-07-08 ENCOUNTER — Telehealth: Payer: 59

## 2021-07-08 ENCOUNTER — Telehealth: Payer: 59 | Admitting: Physician Assistant

## 2021-07-08 DIAGNOSIS — J301 Allergic rhinitis due to pollen: Secondary | ICD-10-CM

## 2021-07-08 MED ORDER — IPRATROPIUM BROMIDE 0.03 % NA SOLN: 2.0000 | Freq: Two times a day (BID) | NASAL | 12 refills | Status: DC

## 2021-07-08 MED ORDER — LEVOCETIRIZINE DIHYDROCHLORIDE 5 MG PO TABS: 5.0000 mg | ORAL_TABLET | Freq: Every evening | ORAL | 0 refills | Status: DC

## 2021-07-08 MED ORDER — IPRATROPIUM BROMIDE 0.03 % NA SOLN: 2.0000 | Freq: Two times a day (BID) | NASAL | 0 refills | Status: DC

## 2021-07-08 MED ORDER — PREDNISONE 20 MG PO TABS: 20.0000 mg | ORAL_TABLET | Freq: Every day | ORAL | 0 refills | Status: DC

## 2021-07-08 NOTE — Addendum Note (Signed)
Addended by: Margaretann Loveless on: 07/08/2021 04:45 PM   Modules accepted: Orders

## 2021-07-08 NOTE — Progress Notes (Signed)
Virtual Visit Consent   Sheena Petty, you are scheduled for a virtual visit with a [facility] provider today.     Just as with appointments in the office, your consent must be obtained to participate.  Your consent will be active for this visit and any virtual visit you may have with one of our providers in the next 365 days.     If you have a MyChart account, a copy of this consent can be sent to you electronically.  All virtual visits are billed to your insurance company just like a traditional visit in the office.    As this is a virtual visit, video technology does not allow for your provider to perform a traditional examination.  This may limit your provider's ability to fully assess your condition.  If your provider identifies any concerns that need to be evaluated in person or the need to arrange testing (such as labs, EKG, etc.), we will make arrangements to do so.     Although advances in technology are sophisticated, we cannot ensure that it will always work on either your end or our end.  If the connection with a video visit is poor, the visit may have to be switched to a telephone visit.  With either a video or telephone visit, we are not always able to ensure that we have a secure connection.     I need to obtain your verbal consent now.   Are you willing to proceed with your visit today?    Sheena Petty has provided verbal consent on 07/08/2021 for a virtual visit (video or telephone).   Margaretann Loveless, PA-C   Date: 07/08/2021 3:38 PM   Virtual Visit via Video Note   I, Margaretann Loveless, connected with  Sheena Petty  ([id number], Feb 13, 1981) on 07/08/21 at  3:30 PM EDT by a video-enabled telemedicine application and verified that I am speaking with the correct person using two identifiers.  Location: Patient: Virtual Visit Location Patient: Home Provider: Virtual Visit Location Provider: Home Office   I discussed the limitations of evaluation and management  by telemedicine and the availability of in person appointments. The patient expressed understanding and agreed to proceed.    History of Present Illness: Sheena Petty is a 40 y.o. who identifies as a female who was assigned female at birth, and is being seen today for sinus congestion.  HPI: Sinusitis This is a new problem. The current episode started yesterday. The problem has been gradually worsening since onset. There has been no fever. Associated symptoms include congestion, headaches and sinus pressure. Pertinent negatives include no coughing, shortness of breath or sore throat. Past treatments include nothing. The treatment provided no relief.  Woke this morning with nasal congestion and swelling around her nose and eyes.  Problems: There are no problems to display for this patient.   Allergies:  Allergies  Allergen Reactions   Other     Cherry fruit   Medications:  Current Outpatient Medications:    ipratropium (ATROVENT) 0.03 % nasal spray, Place 2 sprays into both nostrils every 12 (twelve) hours., Disp: 30 mL, Rfl: 12   levocetirizine (XYZAL) 5 MG tablet, Take 1 tablet (5 mg total) by mouth every evening., Disp: 30 tablet, Rfl: 0   predniSONE (DELTASONE) 20 MG tablet, Take 1 tablet (20 mg total) by mouth daily with breakfast., Disp: 5 tablet, Rfl: 0   albuterol (VENTOLIN HFA) 108 (90 Base) MCG/ACT inhaler, Inhale 1-2 puffs into  the lungs every 6 (six) hours as needed for wheezing or shortness of breath., Disp: 1 each, Rfl: 0   azelastine (OPTIVAR) 0.05 % ophthalmic solution, Place 1 drop into the right eye 2 (two) times daily., Disp: 6 mL, Rfl: 0   diclofenac (VOLTAREN) 0.1 % ophthalmic solution, Place 1 drop into the right eye 4 (four) times daily., Disp: 5 mL, Rfl: 0   naproxen (NAPROSYN) 375 MG tablet, Take 1 tablet (375 mg total) by mouth 2 (two) times daily., Disp: 20 tablet, Rfl: 0   promethazine-dextromethorphan (PROMETHAZINE-DM) 6.25-15 MG/5ML syrup, Take 5 mLs by mouth  3 (three) times daily as needed for cough., Disp: 118 mL, Rfl: 0   trimethoprim-polymyxin b (POLYTRIM) ophthalmic solution, Place 1 drop into the right eye every 4 (four) hours., Disp: 10 mL, Rfl: 0  Observations/Objective: Patient is well-developed, well-nourished in no acute distress.  Resting comfortably at home.  Head is normocephalic, atraumatic.  No labored breathing.  Speech is clear and coherent with logical content.  Patient is alert and oriented at baseline.  Mild swelling noted in the area just below the eyes bilaterally, no redness noted  Assessment and Plan: 1. Non-seasonal allergic rhinitis due to pollen - levocetirizine (XYZAL) 5 MG tablet; Take 1 tablet (5 mg total) by mouth every evening.  Dispense: 30 tablet; Refill: 0 - ipratropium (ATROVENT) 0.03 % nasal spray; Place 2 sprays into both nostrils every 12 (twelve) hours.  Dispense: 30 mL; Refill: 12 - predniSONE (DELTASONE) 20 MG tablet; Take 1 tablet (20 mg total) by mouth daily with breakfast.  Dispense: 5 tablet; Refill: 0  - Suspect allergic rhinitis vs viral sinusitis - Will treat with ipratropium nasal spray for congestion and rhinorrhea (patient states flonase does not help) - Restart Xyzal nightly - Prednisone 20mg  given for acute swelling around eyes and nose x 5 days - Seek in person evaluation if not improving or if symptoms worsen  Follow Up Instructions: I discussed the assessment and treatment plan with the patient. The patient was provided an opportunity to ask questions and all were answered. The patient agreed with the plan and demonstrated an understanding of the instructions.  A copy of instructions were sent to the patient via MyChart.  The patient was advised to call back or seek an in-person evaluation if the symptoms worsen or if the condition fails to improve as anticipated.  Time:  I spent 12 minutes with the patient via telehealth technology discussing the above problems/concerns.     , PA-C

## 2021-07-08 NOTE — Patient Instructions (Signed)
Sheena Petty, thank you for joining Margaretann Loveless, PA-C for today's virtual visit.  While this provider is not your primary care provider (PCP), if your PCP is located in our provider database this encounter information will be shared with them immediately following your visit.  Consent: (Patient) Sheena Petty provided verbal consent for this virtual visit at the beginning of the encounter.  Current Medications:  Current Outpatient Medications:    ipratropium (ATROVENT) 0.03 % nasal spray, Place 2 sprays into both nostrils every 12 (twelve) hours., Disp: 30 mL, Rfl: 12   levocetirizine (XYZAL) 5 MG tablet, Take 1 tablet (5 mg total) by mouth every evening., Disp: 30 tablet, Rfl: 0   predniSONE (DELTASONE) 20 MG tablet, Take 1 tablet (20 mg total) by mouth daily with breakfast., Disp: 5 tablet, Rfl: 0   albuterol (VENTOLIN HFA) 108 (90 Base) MCG/ACT inhaler, Inhale 1-2 puffs into the lungs every 6 (six) hours as needed for wheezing or shortness of breath., Disp: 1 each, Rfl: 0   azelastine (OPTIVAR) 0.05 % ophthalmic solution, Place 1 drop into the right eye 2 (two) times daily., Disp: 6 mL, Rfl: 0   diclofenac (VOLTAREN) 0.1 % ophthalmic solution, Place 1 drop into the right eye 4 (four) times daily., Disp: 5 mL, Rfl: 0   naproxen (NAPROSYN) 375 MG tablet, Take 1 tablet (375 mg total) by mouth 2 (two) times daily., Disp: 20 tablet, Rfl: 0   promethazine-dextromethorphan (PROMETHAZINE-DM) 6.25-15 MG/5ML syrup, Take 5 mLs by mouth 3 (three) times daily as needed for cough., Disp: 118 mL, Rfl: 0   trimethoprim-polymyxin b (POLYTRIM) ophthalmic solution, Place 1 drop into the right eye every 4 (four) hours., Disp: 10 mL, Rfl: 0   Medications ordered in this encounter:  Meds ordered this encounter  Medications   levocetirizine (XYZAL) 5 MG tablet    Sig: Take 1 tablet (5 mg total) by mouth every evening.    Dispense:  30 tablet    Refill:  0    Order Specific Question:    Supervising Provider    Answer:   MILLER, BRIAN [[id number]]   ipratropium (ATROVENT) 0.03 % nasal spray    Sig: Place 2 sprays into both nostrils every 12 (twelve) hours.    Dispense:  30 mL    Refill:  12    Order Specific Question:   Supervising Provider    Answer:   Hyacinth Meeker, BRIAN [[id number]]   predniSONE (DELTASONE) 20 MG tablet    Sig: Take 1 tablet (20 mg total) by mouth daily with breakfast.    Dispense:  5 tablet    Refill:  0    Order Specific Question:   Supervising Provider    Answer:   Hyacinth Meeker, BRIAN [[id number]]     *If you need refills on other medications prior to your next appointment, please contact your pharmacy*  Follow-Up: Call back or seek an in-person evaluation if the symptoms worsen or if the condition fails to improve as anticipated.  Other Instructions Allergies, Adult An allergy means that your body reacts to something that bothers it (allergen). This can happen from something that you eat, breathe in, or touch. Allergies often affect the nose, eyes, skin, and stomach. They can be mild, moderate, or very bad (severe). An allergy cannot spread from person to person. They can happen at any age. Sometimes, people outgrow them. What are the causes? Outdoor things, such as pollen, car fumes, and mold. Indoor things, such as dust, smoke, mold, and  pets. Foods. Medicines. Things that bother your skin, such as perfume and bug bites. What increases the risk? Having family members with allergies or asthma. What are the signs or symptoms? Symptoms depend on how bad your allergy is. Mild to moderate symptoms Runny nose, stuffy nose, or sneezing. Itchy mouth, ears, or throat. A feeling of mucus dripping down the back of your throat. Sore throat. Eyes that are itchy, red, watery, or puffy. A skin rash, or red, swollen areas of skin (hives). Stomach cramps or bloating. Severe symptoms Very bad allergies to food, medicine, or bug bites may cause a very bad allergy reaction  (anaphylaxis). This can be life-threatening. Symptoms include: A red face. Wheezing or coughing. Swollen lips, tongue, or mouth. Tight or swollen throat. Chest pain or tightness, or a fast heartbeat. Trouble breathing or shortness of breath. Pain in your belly (abdomen), vomiting, or watery poop (diarrhea). Feeling dizzy or fainting. How is this treated?   Treatment for this condition depends on your symptoms. Treatment may include: Cold, wet cloths for itching and swelling. Eye drops, nose sprays, or skin creams. Washing out your nose each day. A humidifier. Medicines. A change to the foods you eat. Being exposed again and again to tiny amounts of allergens. This helps your body get used to them. You might have: Allergy shots. Very small amounts of allergen put under your tongue. An emergency shot (auto-injector pen) if you have a very bad allergy reaction. This is a medicine with a needle. You can put it into your skin by yourself. Your doctor will teach you how to use it. Follow these instructions at home: Medicines  Take or apply over-the-counter and prescription medicines only as told by your doctor. If you are at risk for a very bad allergy reaction, keep an auto-injector pen with you all the time. Eating and drinking Follow instructions from your doctor about what to eat and drink. Drink enough fluid to keep your pee (urine) pale yellow. General instructions If you have ever had a very bad allergy reaction, wear a medical alert bracelet or necklace. Stay away from things that you are allergic to. Keep all follow-up visits as told by your doctor. This is important. Contact a doctor if: Your symptoms do not get better with treatment. Get help right away if: You have symptoms of a very bad allergy reaction. These include: A swollen mouth, tongue, or throat. Pain or tightness in your chest. Trouble breathing. Being short of breath. Dizziness. Fainting. Very bad pain  in your belly. Vomiting. Watery poop. These symptoms may be an emergency. Do not wait to see if the symptoms will go away. Get medical help right away. Call your local emergency services (911 in the [place] Do not drive yourself to the hospital. Summary Take or apply over-the-counter and prescription medicines only as told by your doctor. Stay away from things you are allergic to. If you are at risk for a very bad allergy reaction, carry an auto-injector pen all the time. Wear a medical alert bracelet or necklace. Very bad allergy reactions can be life-threatening. Get help right away. This information is not intended to replace advice given to you by your health care provider. Make sure you discuss any questions you have with your health care provider. Document Revised: 08/31/2019 Document Reviewed: 08/31/2019 Elsevier Patient Education  2022 [employer].    If you have been instructed to have an in-person evaluation today at a local Urgent Care facility, please use the link below.  It will take you to a list of all of our available [facility], including address, phone number and hours of operation. Please do not delay care.  [facility]  If you or a family member do not have a primary care provider, use the link below to schedule a visit and establish care. When you choose a [facility] primary care physician or advanced practice provider, you gain a long-term partner in health. Find a Primary Care Provider  Learn more about [facility]'s in-office and virtual care options: [facility]

## 2022-06-23 ENCOUNTER — Ambulatory Visit (HOSPITAL_COMMUNITY)
Admission: EM | Admit: 2022-06-23 | Discharge: 2022-06-23 | Disposition: A | Discharge status: Home / Self Care | Payer: BC Managed Care – PPO | Attending: Family Medicine | Admitting: Family Medicine

## 2022-06-23 ENCOUNTER — Encounter (HOSPITAL_COMMUNITY): Payer: Self-pay

## 2022-06-23 DIAGNOSIS — K121 Other forms of stomatitis: Secondary | ICD-10-CM | POA: Diagnosis present

## 2022-06-23 DIAGNOSIS — R0981 Nasal congestion: Secondary | ICD-10-CM | POA: Diagnosis present

## 2022-06-23 LAB — POCT RAPID STREP A, ED / UC: Streptococcus, Group A Screen (Direct): NEGATIVE

## 2022-06-23 MED ORDER — NYSTATIN 100000 UNIT/ML MT SUSP: 5.0000 mL | Freq: Four times a day (QID) | OROMUCOSAL | 0 refills | Status: DC | PRN

## 2022-06-23 MED ORDER — FLUTICASONE PROPIONATE 50 MCG/ACT NA SUSP: 1.0000 | Freq: Every day | NASAL | 0 refills | Status: DC

## 2022-06-23 NOTE — ED Provider Notes (Signed)
[facility]    CSN: [id number] Arrival date & time: 06/23/22  1747      History   Chief Complaint Chief Complaint  Patient presents with   Sore Throat   Ear Pain   Fever    HPI REIZEL CALZADA is a 41 y.o. female.   Pleasant 41 year old female with a known history of severe allergies presents today due to concerns of ear pain and a sore throat for the past 2 days.  Started yesterday, much worse today.  She works at [employer] and states that over the past month it has been under renovation.  She reports the other day upon going home, she had dust and paint chips all over her hair.  She is concerned this may be contributing to her URI symptoms.  She feels like her lymph nodes are swollen.  Subjective fever.  Has not taken any over-the-counter medications for her symptoms.  Does use azelastine nasal spray and other allergy medications on a routine basis.  Denies GI symptoms, vomiting, decreased appetite, rash.   Sore Throat  Fever   History reviewed. No pertinent past medical history.  There are no problems to display for this patient.   Past Surgical History:  Procedure Laterality Date   CESAREAN SECTION     TUBAL LIGATION      OB History   No obstetric history on file.      Home Medications    Prior to Admission medications   Medication Sig Start Date End Date Taking? Authorizing Provider  fluticasone (FLONASE) 50 MCG/ACT nasal spray Place 1 spray into both nostrils daily. 06/23/22  Yes Iram Lundberg L, PA  magic mouthwash (nystatin, lidocaine, diphenhydrAMINE) suspension Take 5 mLs by mouth 4 (four) times daily as needed for mouth pain. 06/23/22  Yes Megham Dwyer L, PA  albuterol (VENTOLIN HFA) 108 (90 Base) MCG/ACT inhaler Inhale 1-2 puffs into the lungs every 6 (six) hours as needed for wheezing or shortness of breath. 04/12/21   Rushie Chestnut, PA-C  azelastine (OPTIVAR) 0.05 % ophthalmic solution Place 1 drop into the right eye 2 (two) times  daily. 04/23/21   Wallis Bamberg, PA-C  diclofenac (VOLTAREN) 0.1 % ophthalmic solution Place 1 drop into the right eye 4 (four) times daily. 04/23/21   Wallis Bamberg, PA-C  levocetirizine (XYZAL) 5 MG tablet Take 1 tablet (5 mg total) by mouth every evening. 07/08/21   Margaretann Loveless, PA-C  naproxen (NAPROSYN) 375 MG tablet Take 1 tablet (375 mg total) by mouth 2 (two) times daily. 03/28/21   Raspet, Noberto Retort, PA-C  predniSONE (DELTASONE) 20 MG tablet Take 1 tablet (20 mg total) by mouth daily with breakfast. 07/08/21   Margaretann Loveless, PA-C  promethazine-dextromethorphan (PROMETHAZINE-DM) 6.25-15 MG/5ML syrup Take 5 mLs by mouth 3 (three) times daily as needed for cough. 03/28/21   Raspet, Noberto Retort, PA-C  trimethoprim-polymyxin b (POLYTRIM) ophthalmic solution Place 1 drop into the right eye every 4 (four) hours. 04/23/21   Wallis Bamberg, PA-C    Family History Family History  Problem Relation Age of Onset   Asthma Mother    Hypertension Mother    Healthy Father     Social History Social History   Tobacco Use   Smoking status: Former    Packs/day: 0.30    Types: Cigarettes   Smokeless tobacco: Never   Tobacco comments:    6 per day  Vaping Use   Vaping Use: Never used  Substance Use Topics  Alcohol use: Yes   Drug use: No     Allergies   Other   Review of Systems Review of Systems  Constitutional:  Positive for fever.  As per HPI   Physical Exam Triage Vital Signs ED Triage Vitals [06/23/22 1900]  Enc Vitals Group     BP (!) 133/97     Pulse Rate 94     Resp 18     Temp 98.2 F (36.8 C)     Temp Source Oral     SpO2 99 %     Weight      Height      Head Circumference      Peak Flow      Pain Score      Pain Loc      Pain Edu?      Excl. in GC?    No data found.  Updated Vital Signs BP (!) 133/97 (BP Location: Left Arm)   Pulse 94   Temp 98.2 F (36.8 C) (Oral)   Resp 18   SpO2 99%   Visual Acuity Right Eye Distance:   Left Eye Distance:    Bilateral Distance:    Right Eye Near:   Left Eye Near:    Bilateral Near:     Physical Exam Vitals and nursing note reviewed.  Constitutional:      General: She is not in acute distress.    Appearance: She is well-developed and normal weight. She is not ill-appearing, toxic-appearing or diaphoretic.  HENT:     Head: Normocephalic and atraumatic.     Right Ear: Tympanic membrane and ear canal normal. No drainage, swelling or tenderness. No middle ear effusion. Tympanic membrane is not erythematous.     Left Ear: Tympanic membrane and ear canal normal. No drainage, swelling or tenderness.  No middle ear effusion. Tympanic membrane is not erythematous.     Nose: No congestion or rhinorrhea.     Mouth/Throat:     Mouth: Mucous membranes are moist. Oral lesions (single ulcer to L soft palate above tonsil) present.     Pharynx: Oropharynx is clear. Uvula midline. No pharyngeal swelling, oropharyngeal exudate, posterior oropharyngeal erythema or uvula swelling.     Tonsils: No tonsillar exudate or tonsillar abscesses.  Eyes:     Extraocular Movements:     Right eye: Normal extraocular motion.     Left eye: Normal extraocular motion.     Conjunctiva/sclera: Conjunctivae normal.     Pupils: Pupils are equal, round, and reactive to light.  Neck:     Thyroid: No thyromegaly.  Cardiovascular:     Rate and Rhythm: Normal rate and regular rhythm.     Heart sounds: No murmur heard. Pulmonary:     Effort: Pulmonary effort is normal. No respiratory distress.     Breath sounds: Normal breath sounds. No stridor. No wheezing, rhonchi or rales.  Abdominal:     Palpations: Abdomen is soft.     Tenderness: There is no abdominal tenderness.  Musculoskeletal:        General: No swelling.     Cervical back: Normal range of motion and neck supple.  Lymphadenopathy:     Cervical: No cervical adenopathy.  Skin:    General: Skin is warm and dry.     Capillary Refill: Capillary refill takes less than  2 seconds.  Neurological:     General: No focal deficit present.     Mental Status: She is alert.  Psychiatric:  Mood and Affect: Mood normal.      UC Treatments / Results  Labs (all labs ordered are listed, but only abnormal results are displayed) Labs Reviewed  CULTURE, GROUP A STREP [facility])  POCT RAPID STREP A, ED / UC    EKG   Radiology No results found.  Procedures Procedures (including critical care time)  Medications Ordered in UC Medications - No data to display  Initial Impression / Assessment and Plan / UC Course  I have reviewed the triage vital signs and the nursing notes.  Pertinent labs & imaging results that were available during my care of the patient were reviewed by me and considered in my medical decision making (see chart for details).     Oral ulceration - likely viral. Supportive measures. Magic mouthwash offered, additional OTC medications recommended. Nasal congestion -stop your current nasal spray, switch to Flonase and saline.  Final Clinical Impressions(s) / UC Diagnoses   Final diagnoses:  Oral ulcer  Nasal congestion     Discharge Instructions      Your sore throat is likely related to a virus. Your strep test is negative. We will call with the results of the throat swab if it requires treatment with antibiotics. Please alternate tylenol with ibuprofen to help with the discomfort or fever. You may also try chloraseptic spray or cepacol lozenges. I have called in a prescription mouthwash you can swish and spit. Stop your current nasal spray, start using flonase (prescription called in) and saline. Monitor for fever >100.3, swollen lymph nodes or white spots on the back of the throat which may indicate a need to return to our clinic.      ED Prescriptions     Medication Sig Dispense Auth. Provider   fluticasone (FLONASE) 50 MCG/ACT nasal spray Place 1 spray into both nostrils daily. 16 mL Thresa Dozier L, PA   magic  mouthwash (nystatin, lidocaine, diphenhydrAMINE) suspension Take 5 mLs by mouth 4 (four) times daily as needed for mouth pain. 180 mL Kekai Geter L, PA      PDMP not reviewed this encounter.   Maretta Bees, [place] 06/23/22 2049

## 2022-06-23 NOTE — ED Triage Notes (Signed)
Pt reports sore throat and ear pain x 2-3 days.

## 2022-06-23 NOTE — Discharge Instructions (Addendum)
Your sore throat is likely related to a virus. Your strep test is negative. We will call with the results of the throat swab if it requires treatment with antibiotics. Please alternate tylenol with ibuprofen to help with the discomfort or fever. You may also try chloraseptic spray or cepacol lozenges. I have called in a prescription mouthwash you can swish and spit. Stop your current nasal spray, start using flonase (prescription called in) and saline. Monitor for fever >100.3, swollen lymph nodes or white spots on the back of the throat which may indicate a need to return to our clinic.

## 2022-06-24 ENCOUNTER — Other Ambulatory Visit (HOSPITAL_COMMUNITY): Payer: Self-pay

## 2022-06-24 LAB — CULTURE, GROUP A STREP (THRC)

## 2022-06-25 ENCOUNTER — Emergency Department
Admission: EM | Admit: 2022-06-25 | Discharge: 2022-06-25 | Disposition: A | Discharge status: Home / Self Care | Payer: BC Managed Care – PPO | Attending: Emergency Medicine | Admitting: Emergency Medicine

## 2022-06-25 ENCOUNTER — Encounter: Payer: Self-pay | Admitting: Medical Oncology

## 2022-06-25 DIAGNOSIS — Z20822 Contact with and (suspected) exposure to covid-19: Secondary | ICD-10-CM | POA: Diagnosis not present

## 2022-06-25 DIAGNOSIS — J020 Streptococcal pharyngitis: Secondary | ICD-10-CM | POA: Diagnosis not present

## 2022-06-25 DIAGNOSIS — J029 Acute pharyngitis, unspecified: Secondary | ICD-10-CM | POA: Diagnosis present

## 2022-06-25 LAB — RESP PANEL BY RT-PCR (FLU A&B, COVID) ARPGX2
Influenza A by PCR: NEGATIVE
Influenza B by PCR: NEGATIVE
SARS Coronavirus 2 by RT PCR: NEGATIVE

## 2022-06-25 LAB — GROUP A STREP BY PCR: Group A Strep by PCR: DETECTED — AB

## 2022-06-25 LAB — MONONUCLEOSIS SCREEN: Mono Screen: NEGATIVE

## 2022-06-25 MED ORDER — ACETAMINOPHEN 325 MG PO TABS
650.0000 mg | ORAL_TABLET | Freq: Once | ORAL | Status: AC
  Administered 2022-06-25: 650 mg via ORAL
  Filled 2022-06-25: qty 2

## 2022-06-25 MED ORDER — PENICILLIN V POTASSIUM 500 MG PO TABS: 500.0000 mg | ORAL_TABLET | Freq: Three times a day (TID) | ORAL | 0 refills | Status: AC

## 2022-06-25 MED ORDER — IBUPROFEN 600 MG PO TABS
600.0000 mg | ORAL_TABLET | Freq: Once | ORAL | Status: AC
  Administered 2022-06-25: 600 mg via ORAL
  Filled 2022-06-25: qty 1

## 2022-06-25 MED ORDER — DEXAMETHASONE SODIUM PHOSPHATE 10 MG/ML IJ SOLN
10.0000 mg | Freq: Once | INTRAMUSCULAR | Status: AC
  Administered 2022-06-25: 10 mg via INTRAMUSCULAR
  Filled 2022-06-25: qty 1

## 2022-06-25 NOTE — ED Notes (Signed)
ED Provider at bedside. 

## 2022-06-25 NOTE — ED Provider Notes (Signed)
[facility] Provider Note    Event Date/Time   First MD Initiated Contact with Patient 06/25/22 [phone]     (approximate)   History   Sore Throat   HPI  Sheena Petty is a 41 y.o. female with no significant past medical history presents today for evaluation of sore throat.  Patient reports that this has been ongoing for 2 days.  She was seen yesterday and that she feels like her throat has gotten more sore today and she has noticed white patches on her tonsils.  She has not taken her temperature at home but notes that she felt sweaty overnight.  She also reports that she has nasal congestion and a slight cough.  No chest pain, shortness of breath, abdominal pain, nausea, vomiting, diarrhea.  She does note that she has pain with swallowing but no difficulty swallowing.  No drooling.  There are no problems to display for this patient.         Physical Exam   Triage Vital Signs: ED Triage Vitals  Enc Vitals Group     BP 06/25/22 0815 (!) 152/102     Pulse Rate 06/25/22 0813 88     Resp 06/25/22 0813 18     Temp 06/25/22 0813 98.4 F (36.9 C)     Temp Source 06/25/22 0813 Oral     SpO2 06/25/22 0813 99 %     Weight 06/25/22 0810 178 lb 9.2 oz (81 kg)     Height 06/25/22 0810 5\' 2"  (1.575 m)     Head Circumference --      Peak Flow --      Pain Score 06/25/22 0810 9     Pain Loc --      Pain Edu? --      Excl. in GC? --     Most recent vital signs: Vitals:   06/25/22 0813 06/25/22 0815  BP:  (!) 152/102  Pulse: 88   Resp: 18   Temp: 98.4 F (36.9 C)   SpO2: 99%     Physical Exam Vitals and nursing note reviewed.  Constitutional:      General: Awake and alert. No acute distress.    Appearance: Normal appearance. The patient is normal weight.  HENT:     Head: Normocephalic and atraumatic.     Mouth: Mucous membranes are moist.  Eyes:     General: PERRL. Normal EOMs        Right eye: No discharge.        Left eye: No discharge.      Conjunctiva/sclera: Conjunctivae normal.  Cardiovascular:     Rate and Rhythm: Normal rate and regular rhythm.     Pulses: Normal pulses.     Heart sounds: Normal heart sounds Pulmonary:     Effort: Pulmonary effort is normal. No respiratory distress.     Breath sounds: Normal breath sounds.  Abdominal:     Abdomen is soft. There is no abdominal tenderness. No rebound or guarding. No distention. Musculoskeletal:        General: No swelling. Normal range of motion.     Cervical back: Normal range of motion and neck supple.  Skin:    General: Skin is warm and dry.     Capillary Refill: Capillary refill takes less than 2 seconds.     Findings: No rash.  Neurological:     Mental Status: The patient is awake and alert.      ED Results /  Procedures / Treatments   Labs (all labs ordered are listed, but only abnormal results are displayed) Labs Reviewed  GROUP A STREP BY PCR - Abnormal; Notable for the following components:      Result Value   Group A Strep by PCR DETECTED (*)    All other components within normal limits  RESP PANEL BY RT-PCR (FLU A&B, COVID) [id number]  MONONUCLEOSIS SCREEN     EKG     RADIOLOGY     PROCEDURES:  Critical Care performed:   Procedures   MEDICATIONS ORDERED IN ED: Medications  dexamethasone (DECADRON) injection 10 mg (10 mg Intramuscular Given 06/25/22 0839)  acetaminophen (TYLENOL) tablet 650 mg (650 mg Oral Given 06/25/22 0858)  ibuprofen (ADVIL) tablet 600 mg (600 mg Oral Given 06/25/22 0858)     IMPRESSION / MDM / ASSESSMENT AND PLAN / ED COURSE  I reviewed the triage vital signs and the nursing notes.   Differential diagnosis includes, but is not limited to, strep pharyngitis, viral pharyngitis, COVID-19, influenza.  Patient is awake and alert, hemodynamically stable and afebrile.  Demonstrates no increased work of breathing.  Uvula is midline, no tonsillar exudate.  No trismus.  No drooling. No voice change. No nuchal rigidity.   Symptoms not consistent with peritonsillar or retropharyngeal abscess.  COVID/flu/RSV and Monospot test were negative, however strep test is positive.  Patient was treated symptomatically with significant improvement of symptoms.  She was started on penicillin for her positive strep test.  She was advised of the importance of taking the entire course of antibiotics even if she begins to feel better.  We also discussed strict return precautions and outpatient follow-up.  Patient understands and agrees with plan.  We discussed strict strict return precautions and the importance of close outpatient follow-up.  Patient understands and agrees with plan.  Discharged in stable condition.   Patient's presentation is most consistent with acute complicated illness / injury requiring diagnostic workup.     FINAL CLINICAL IMPRESSION(S) / ED DIAGNOSES   Final diagnoses:  Strep pharyngitis     Rx / DC Orders   ED Discharge Orders          Ordered    penicillin v potassium (VEETID) 500 MG tablet  3 times daily        06/25/22 1042             Note:  This document was prepared using Dragon voice recognition software and may include unintentional dictation errors.   Keturah Shavers 06/25/22 1052    Dionne Bucy, MD 06/25/22 [phone]

## 2022-06-25 NOTE — Discharge Instructions (Signed)
Your strep test was positive.  Please continue to take the antibiotics for the entire course even if you begin to feel better.  Please return for any new, worsening, or change in symptoms or other concerns.  It was a pleasure caring for you today.

## 2022-06-25 NOTE — ED Triage Notes (Signed)
Pt reports that she has been having sore throat x 3 days with white patches to back of throat.

## 2022-12-30 ENCOUNTER — Ambulatory Visit: Payer: BC Managed Care – PPO | Admitting: Podiatry

## 2023-01-06 ENCOUNTER — Ambulatory Visit: Payer: BC Managed Care – PPO | Admitting: Podiatry

## 2023-02-22 ENCOUNTER — Emergency Department (HOSPITAL_BASED_OUTPATIENT_CLINIC_OR_DEPARTMENT_OTHER)
Admission: EM | Admit: 2023-02-22 | Discharge: 2023-02-22 | Disposition: A | Discharge status: Home / Self Care | Payer: BC Managed Care – PPO | Attending: Emergency Medicine | Admitting: Emergency Medicine

## 2023-02-22 ENCOUNTER — Emergency Department (HOSPITAL_BASED_OUTPATIENT_CLINIC_OR_DEPARTMENT_OTHER): Payer: BC Managed Care – PPO

## 2023-02-22 ENCOUNTER — Other Ambulatory Visit: Payer: Self-pay

## 2023-02-22 DIAGNOSIS — D72829 Elevated white blood cell count, unspecified: Secondary | ICD-10-CM | POA: Insufficient documentation

## 2023-02-22 DIAGNOSIS — K5732 Diverticulitis of large intestine without perforation or abscess without bleeding: Secondary | ICD-10-CM | POA: Insufficient documentation

## 2023-02-22 LAB — CBC
HCT: 40.5 % (ref 36.0–46.0)
Hemoglobin: 13.1 g/dL (ref 12.0–15.0)
MCH: 28.7 pg (ref 26.0–34.0)
MCHC: 32.3 g/dL (ref 30.0–36.0)
MCV: 88.6 fL (ref 80.0–100.0)
Platelets: 264 10*3/uL (ref 150–400)
RBC: 4.57 MIL/uL (ref 3.87–5.11)
RDW: 13.9 % (ref 11.5–15.5)
WBC: 17.7 10*3/uL — ABNORMAL HIGH (ref 4.0–10.5)
nRBC: 0 % (ref 0.0–0.2)

## 2023-02-22 LAB — COMPREHENSIVE METABOLIC PANEL
ALT: 22 U/L (ref 0–44)
AST: 19 U/L (ref 15–41)
Albumin: 4 g/dL (ref 3.5–5.0)
Alkaline Phosphatase: 74 U/L (ref 38–126)
Anion gap: 9 (ref 5–15)
BUN: 11 mg/dL (ref 6–20)
CO2: 24 mmol/L (ref 22–32)
Calcium: 8.8 mg/dL — ABNORMAL LOW (ref 8.9–10.3)
Chloride: 103 mmol/L (ref 98–111)
Creatinine, Ser: 0.64 mg/dL (ref 0.44–1.00)
GFR, Estimated: >60 mL/min (ref >60)
Glucose, Bld: 123 mg/dL — ABNORMAL HIGH (ref 70–99)
Potassium: 3.5 mmol/L (ref 3.5–5.1)
Sodium: 136 mmol/L (ref 135–145)
Total Bilirubin: 0.7 mg/dL (ref 0.3–1.2)
Total Protein: 7 g/dL (ref 6.5–8.1)

## 2023-02-22 LAB — PREGNANCY, URINE: Preg Test, Ur: NEGATIVE

## 2023-02-22 LAB — URINALYSIS, ROUTINE W REFLEX MICROSCOPIC
Bilirubin Urine: NEGATIVE
Glucose, UA: NEGATIVE mg/dL
Leukocytes,Ua: NEGATIVE
Nitrite: NEGATIVE
Specific Gravity, Urine: 1.037 — ABNORMAL HIGH (ref 1.005–1.030)
pH: 6.5 (ref 5.0–8.0)

## 2023-02-22 LAB — LIPASE, BLOOD: Lipase: 10 U/L — ABNORMAL LOW (ref 11–51)

## 2023-02-22 MED ORDER — OXYCODONE-ACETAMINOPHEN 5-325 MG PO TABS
1.0000 | ORAL_TABLET | Freq: Once | ORAL | Status: AC
  Administered 2023-02-22: 1 via ORAL
  Filled 2023-02-22: qty 1

## 2023-02-22 MED ORDER — CIPROFLOXACIN HCL 500 MG PO TABS: 500.0000 mg | ORAL_TABLET | Freq: Two times a day (BID) | ORAL | 0 refills | Status: AC

## 2023-02-22 MED ORDER — METRONIDAZOLE 500 MG PO TABS
500.0000 mg | ORAL_TABLET | Freq: Once | ORAL | Status: AC
  Administered 2023-02-22: 500 mg via ORAL
  Filled 2023-02-22: qty 1

## 2023-02-22 MED ORDER — METRONIDAZOLE 500 MG PO TABS: 500.0000 mg | ORAL_TABLET | Freq: Three times a day (TID) | ORAL | 0 refills | Status: AC

## 2023-02-22 MED ORDER — CIPROFLOXACIN HCL 500 MG PO TABS
500.0000 mg | ORAL_TABLET | Freq: Once | ORAL | Status: AC
  Administered 2023-02-22: 500 mg via ORAL
  Filled 2023-02-22: qty 1

## 2023-02-22 NOTE — ED Notes (Signed)
Reviewed AVS/discharge instruction with patient. Time allotted for and all questions answered. Patient is agreeable for d/c and escorted to ed exit by staff.  

## 2023-02-22 NOTE — Discharge Instructions (Signed)
You were seen in the ED for abdominal pain. Based on your labs and imaging, you have a condition called diverticulitis. To treat this, you should be on antibiotics for the next 5 days to ensure symptomatic relief. Please plan to follow up with your primary care provider for further evaluation. I have also provided information for a GI specialist if you feel that your symptoms are not improving well. If symptoms are worsening, please return to the ED for further evaluation.

## 2023-02-22 NOTE — ED Provider Notes (Signed)
 [place] EMERGENCY DEPARTMENT AT [facility] Provider Note   CSN: [id number] Arrival date & time: 02/22/23  1351     History Chief Complaint  Patient presents with   Abdominal Pain    Sheena Petty is a 42 y.o. female.  Patient presented to the emergency department with complaints of abdominal pain. Pain started yesterday. She reports that pain presented in lower abdomen without radiation. States some pressure with urination. Denies any nausea, vomiting, or diarrhea. No prior abdominal surgeries. No known GI history such as colon cancer, Crohn's disease, ulcerative colitis, or IBS.   Abdominal Pain      Home Medications Prior to Admission medications   Medication Sig Start Date End Date Taking? Authorizing Provider  ciprofloxacin (CIPRO) 500 MG tablet Take 1 tablet (500 mg total) by mouth every 12 (twelve) hours for 5 days. 02/22/23 02/27/23 Yes Smitty Knudsen, PA-C  metroNIDAZOLE (FLAGYL) 500 MG tablet Take 1 tablet (500 mg total) by mouth 3 (three) times daily for 5 days. 02/22/23 02/27/23 Yes Smitty Knudsen, PA-C  albuterol (VENTOLIN HFA) 108 (90 Base) MCG/ACT inhaler Inhale 1-2 puffs into the lungs every 6 (six) hours as needed for wheezing or shortness of breath. 04/12/21   Rushie Chestnut, PA-C  azelastine (OPTIVAR) 0.05 % ophthalmic solution Place 1 drop into the right eye 2 (two) times daily. 04/23/21   Wallis Bamberg, PA-C  diclofenac (VOLTAREN) 0.1 % ophthalmic solution Place 1 drop into the right eye 4 (four) times daily. 04/23/21   Wallis Bamberg, PA-C  fluticasone (FLONASE) 50 MCG/ACT nasal spray Place 1 spray into both nostrils daily. 06/23/22   Crain, Whitney L, PA  levocetirizine (XYZAL) 5 MG tablet Take 1 tablet (5 mg total) by mouth every evening. 07/08/21   Margaretann Loveless, PA-C  magic mouthwash (nystatin, lidocaine, diphenhydrAMINE) suspension Take 5 mLs by mouth 4 (four) times daily as needed for mouth pain. 06/23/22   Crain, Whitney L, PA  naproxen  (NAPROSYN) 375 MG tablet Take 1 tablet (375 mg total) by mouth 2 (two) times daily. 03/28/21   Raspet, Noberto Retort, PA-C  predniSONE (DELTASONE) 20 MG tablet Take 1 tablet (20 mg total) by mouth daily with breakfast. 07/08/21   Margaretann Loveless, PA-C  promethazine-dextromethorphan (PROMETHAZINE-DM) 6.25-15 MG/5ML syrup Take 5 mLs by mouth 3 (three) times daily as needed for cough. 03/28/21   Raspet, Noberto Retort, PA-C  trimethoprim-polymyxin b (POLYTRIM) ophthalmic solution Place 1 drop into the right eye every 4 (four) hours. 04/23/21   Wallis Bamberg, PA-C      Allergies    Other    Review of Systems   Review of Systems  Gastrointestinal:  Positive for abdominal pain.  All other systems reviewed and are negative.   Physical Exam Updated Vital Signs BP 132/78   Pulse 88   Temp 97.7 F (36.5 C) (Oral)   Resp 18   LMP 02/21/2023 (Exact Date)   SpO2 97%  Physical Exam Vitals and nursing note reviewed.  Constitutional:      General: She is not in acute distress.    Appearance: She is well-developed.  HENT:     Head: Normocephalic and atraumatic.  Eyes:     Conjunctiva/sclera: Conjunctivae normal.  Cardiovascular:     Rate and Rhythm: Normal rate and regular rhythm.     Heart sounds: No murmur heard. Pulmonary:     Effort: Pulmonary effort is normal. No respiratory distress.     Breath sounds: Normal breath sounds.  Abdominal:     Palpations: Abdomen is soft.     Tenderness: There is abdominal tenderness in the suprapubic area. There is no right CVA tenderness, left CVA tenderness or guarding.  Musculoskeletal:        General: No swelling.     Cervical back: Neck supple.  Skin:    General: Skin is warm and dry.     Capillary Refill: Capillary refill takes less than 2 seconds.     Findings: No rash.  Neurological:     Mental Status: She is alert.  Psychiatric:        Mood and Affect: Mood normal.     ED Results / Procedures / Treatments   Labs (all labs ordered are listed, but  only abnormal results are displayed) Labs Reviewed  LIPASE, BLOOD - Abnormal; Notable for the following components:      Result Value   Lipase 10 (*)    All other components within normal limits  COMPREHENSIVE METABOLIC PANEL - Abnormal; Notable for the following components:   Glucose, Bld 123 (*)    Calcium 8.8 (*)    All other components within normal limits  CBC - Abnormal; Notable for the following components:   WBC 17.7 (*)    All other components within normal limits  URINALYSIS, ROUTINE W REFLEX MICROSCOPIC - Abnormal; Notable for the following components:   Specific Gravity, Urine 1.037 (*)    Hgb urine dipstick LARGE (*)    Ketones, ur TRACE (*)    Protein, ur TRACE (*)    Bacteria, UA RARE (*)    All other components within normal limits  PREGNANCY, URINE    EKG None  Radiology CT ABDOMEN PELVIS WO CONTRAST  Result Date: 02/22/2023 CLINICAL DATA:  Central abdomen pain. EXAM: CT ABDOMEN AND PELVIS WITHOUT CONTRAST TECHNIQUE: Multidetector CT imaging of the abdomen and pelvis was performed following the standard protocol without IV contrast. RADIATION DOSE REDUCTION: This exam was performed according to the departmental dose-optimization program which includes automated exposure control, adjustment of the mA and/or kV according to patient size and/or use of iterative reconstruction technique. COMPARISON:  CT 06/10/2020 FINDINGS: Lower chest: There is some linear opacity at the right lung base likely scar or atelectasis. No pleural effusion. 2-3 mm nodule left lower lobe on series 4, image 8. Small hiatal hernia. Hepatobiliary: Fatty liver infiltration.  Gallbladder is nondilated. Pancreas: Unremarkable. No pancreatic ductal dilatation or surrounding inflammatory changes. Spleen: Normal in size without focal abnormality. Adrenals/Urinary Tract: The adrenal glands are preserved. No abnormal calcifications are seen within either kidney nor along the expected course of either ureter.  Exophytic lateral left-sided small renal cyst is stable, Bosniak 1 lesion measuring 15 mm. No specific imaging follow-up. Preserved contour of the urinary bladder. Stomach/Bowel: Wall thickening seen along the proximal sigmoid colon with moderate stranding and diverticula consistent with an area of diverticulitis. No clear extraluminal air, well-defined fluid collection or signs of obstruction. Scattered other left-sided colonic diverticula are seen. Mild proximal colonic stool. Normal appendix extends medial to the cecum in the right lower quadrant. Stomach and small bowel are nondilated. Vascular/Lymphatic: Normal caliber aorta and IVC. There are several small less than 1 cm in size retroperitoneal nodes identified which are not pathologic by size criteria these could be reactive. Reproductive: Tubal ligation clips. Uterus is present. No adnexal mass. Presumed tampon in the vagina Other: No free air or ascites. Small epigastric midline anterior abdominal wall hernia Musculoskeletal: Slight degenerative changes along the lumbar  spine. IMPRESSION: Proximal sigmoid diverticulitis with wall thickening, stranding and numerous diverticula. No complicating features at this time obstruction, free air or abscess formation. Recommend follow-up to confirm clearance and exclude secondary pathology. Fatty liver infiltration. Normal appendix. Small hiatal hernia. Epigastric small fat containing anterior abdominal wall hernia Electronically Signed   By: Karen Kays M.D.   On: 02/22/2023 16:18    Procedures Procedures   Medications Ordered in ED Medications  metroNIDAZOLE (FLAGYL) tablet 500 mg (has no administration in time range)  ciprofloxacin (CIPRO) tablet 500 mg (has no administration in time range)  oxyCODONE-acetaminophen (PERCOCET/ROXICET) 5-325 MG per tablet 1 tablet (1 tablet Oral Given 02/22/23 1513)    ED Course/ Medical Decision Making/ A&P                           Medical Decision Making Amount  and/or Complexity of Data Reviewed Labs: ordered. Radiology: ordered.  Risk Prescription drug management.   This patient presents to the ED for concern of abdominal pain.  Differential diagnosis includes constipation, bowel obstruction, diverticulosis, diverticulitis, gastroenteritis   Lab Tests:  I Ordered, and personally interpreted labs.  The pertinent results include: CBC with notable leukocytosis 17.7 otherwise unremarkable, CMP unremarkable, negative urine pregnancy, lipase negative, urinalysis with blood, ketones, protein, bacteria   Imaging Studies ordered:  I ordered imaging studies including CT abdomen pelvis I independently visualized and interpreted imaging which showed sigmoid diverticulitis I agree with the radiologist interpretation   Medicines ordered and prescription drug management:  I ordered medication including metronidazole, ciprofloxacin for diverticulitis Reevaluation of the patient after these medicines showed that the patient unchanged I have reviewed the patients home medicines and have made adjustments as needed   Problem List / ED Course:  Patient presented to the emergency department complaints of abdominal pain.  She reports this pain has been present for a day or so and is located in the lower/outer aspect of her lower abdomen.  She reports the pain is not radiating anywhere.  Not currently endorsing any nausea, vomiting, chest pain, shortness of breath.  Bowel moods still appear to be mostly consistent and normal.  Given concerning presentation, lab workup initiated as well as CT abdomen pelvis.  Labs were significant for a leukocytosis at 17.7.  Imaging pending at this time.. CT abdomen pelvis performed with results showing evidence of sigmoid diverticulitis no but no other acute abdominal pelvic abnormalities.  Informed patient of findings and recommended treatment with combination therapy of Flagyl and Cipro.  Also advised patient to manage his  condition by advising her primary care provider in the case that symptoms were to recur in the future that she may be treated accordingly.  Will also send a prescription for Cipro and Flagyl to patient's pharmacy advised patient to take these as prescribed for the next 5 days.  Patient is agreeable with this treatment plan verbalized understanding all return precautions.  All questions answered prior to patient discharge.  Final Clinical Impression(s) / ED Diagnoses Final diagnoses:  Sigmoid diverticulitis  Leukocytosis, unspecified type    Rx / DC Orders ED Discharge Orders          Ordered    metroNIDAZOLE (FLAGYL) 500 MG tablet  3 times daily        02/22/23 1709    ciprofloxacin (CIPRO) 500 MG tablet  Every 12 hours        02/22/23 1709  Smitty Knudsen, PA-C 02/22/23 1718    Rexford Maus, DO 02/25/23 [phone]

## 2023-02-22 NOTE — ED Triage Notes (Signed)
Complains of lower/center abd pain. Non-radiating. Started yesterday. -N/-V, -CP,-SOB. No changes to BM.

## 2023-10-08 ENCOUNTER — Other Ambulatory Visit: Payer: Self-pay

## 2023-10-08 ENCOUNTER — Emergency Department
Admission: EM | Admit: 2023-10-08 | Discharge: 2023-10-08 | Disposition: A | Discharge status: Home / Self Care | Payer: BLUE CROSS/BLUE SHIELD | Attending: Emergency Medicine | Admitting: Emergency Medicine

## 2023-10-08 ENCOUNTER — Encounter: Payer: Self-pay | Admitting: Emergency Medicine

## 2023-10-08 ENCOUNTER — Emergency Department: Payer: BLUE CROSS/BLUE SHIELD

## 2023-10-08 DIAGNOSIS — S0512XA Contusion of eyeball and orbital tissues, left eye, initial encounter: Secondary | ICD-10-CM | POA: Insufficient documentation

## 2023-10-08 DIAGNOSIS — M542 Cervicalgia: Secondary | ICD-10-CM | POA: Diagnosis not present

## 2023-10-08 DIAGNOSIS — H05239 Hemorrhage of unspecified orbit: Secondary | ICD-10-CM

## 2023-10-08 DIAGNOSIS — S0592XA Unspecified injury of left eye and orbit, initial encounter: Secondary | ICD-10-CM | POA: Diagnosis present

## 2023-10-08 DIAGNOSIS — R930 Abnormal findings on diagnostic imaging of skull and head, not elsewhere classified: Secondary | ICD-10-CM | POA: Diagnosis not present

## 2023-10-08 MED ORDER — HYDROCODONE-ACETAMINOPHEN 5-325 MG PO TABS
1.0000 | ORAL_TABLET | Freq: Once | ORAL | Status: AC
  Administered 2023-10-08: 1 via ORAL
  Filled 2023-10-08: qty 1

## 2023-10-08 MED ORDER — HYDROCODONE-ACETAMINOPHEN 5-325 MG PO TABS: 1.0000 | ORAL_TABLET | Freq: Four times a day (QID) | ORAL | 0 refills | Status: DC | PRN
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
## 2023-10-08 NOTE — Discharge Instructions (Signed)
Follow-up with Dr. Brooke Dare at his office listed above.  He will see him tomorrow at 115.  May want to arrive at 1:00 to do paperwork. Return the emergency department if you feel that your eye is swelling and protruding. Take pain medication as needed.

## 2024-03-04 ENCOUNTER — Telehealth: Payer: Self-pay | Admitting: Family Medicine

## 2024-03-04 NOTE — Telephone Encounter (Signed)
 Patient called in stating she has been unable to see if she has certain vaccines and she would like to know if she does need them and if so does she qualify to receive them. She needs MMR, Varicella, and Tdap. Please call

## 2024-03-07 NOTE — Telephone Encounter (Signed)
 Message from patient regarding need for immunizations. Phone call to patient who explains she is starting new job with [employer] and needs MMR, Varicella, Tdap. Has had difficulty locating her childhood vaccine record as she moved around a lot while growing up, but always lived in [place] . States last school attended was [employer]. Also states history of chicken pox.  Counseled patient to obtain childhood vaccine record and recommended to check with [employer] school system for record. Since hx chicken pox, discussed having a varicella titer to determine varicella immunity. If immune to varicella, then no varicella vaccine would be indicated. Questions answered and verbalizes understanding. Patient plans to check with [employer] for vaccine record first before scheduling appt for vaccines. Destry Bezdek, RN

## 2024-04-26 ENCOUNTER — Other Ambulatory Visit: Payer: Self-pay

## 2024-04-26 ENCOUNTER — Emergency Department (HOSPITAL_BASED_OUTPATIENT_CLINIC_OR_DEPARTMENT_OTHER)

## 2024-04-26 ENCOUNTER — Encounter (HOSPITAL_BASED_OUTPATIENT_CLINIC_OR_DEPARTMENT_OTHER): Payer: Self-pay | Admitting: Emergency Medicine

## 2024-04-26 ENCOUNTER — Emergency Department (HOSPITAL_COMMUNITY): Admission: EM | Admit: 2024-04-26 | Discharge: 2024-04-26 | Discharge status: Against Medical Advice

## 2024-04-26 ENCOUNTER — Emergency Department (HOSPITAL_BASED_OUTPATIENT_CLINIC_OR_DEPARTMENT_OTHER)
Admission: EM | Admit: 2024-04-26 | Discharge: 2024-04-26 | Disposition: A | Discharge status: Home / Self Care | Attending: Emergency Medicine | Admitting: Emergency Medicine

## 2024-04-26 DIAGNOSIS — F17200 Nicotine dependence, unspecified, uncomplicated: Secondary | ICD-10-CM | POA: Insufficient documentation

## 2024-04-26 DIAGNOSIS — D72829 Elevated white blood cell count, unspecified: Secondary | ICD-10-CM | POA: Diagnosis not present

## 2024-04-26 DIAGNOSIS — R1084 Generalized abdominal pain: Secondary | ICD-10-CM | POA: Diagnosis present

## 2024-04-26 DIAGNOSIS — K5792 Diverticulitis of intestine, part unspecified, without perforation or abscess without bleeding: Secondary | ICD-10-CM

## 2024-04-26 DIAGNOSIS — K5732 Diverticulitis of large intestine without perforation or abscess without bleeding: Secondary | ICD-10-CM | POA: Diagnosis not present

## 2024-04-26 LAB — CBC WITH DIFFERENTIAL/PLATELET
Abs Immature Granulocytes: 0.09 10*3/uL — ABNORMAL HIGH (ref 0.00–0.07)
Basophils Absolute: 0.1 10*3/uL (ref 0.0–0.1)
Basophils Relative: 0 %
Eosinophils Absolute: 0.2 10*3/uL (ref 0.0–0.5)
Eosinophils Relative: 1 %
HCT: 40.6 % (ref 36.0–46.0)
Hemoglobin: 13.5 g/dL (ref 12.0–15.0)
Immature Granulocytes: 1 %
Lymphocytes Relative: 13 %
Lymphs Abs: 2.6 10*3/uL (ref 0.7–4.0)
MCH: 29.5 pg (ref 26.0–34.0)
MCHC: 33.3 g/dL (ref 30.0–36.0)
MCV: 88.6 fL (ref 80.0–100.0)
Monocytes Absolute: 1.5 10*3/uL — ABNORMAL HIGH (ref 0.1–1.0)
Monocytes Relative: 8 %
Neutro Abs: 15.4 10*3/uL — ABNORMAL HIGH (ref 1.7–7.7)
Neutrophils Relative %: 77 %
Platelets: 268 10*3/uL (ref 150–400)
RBC: 4.58 MIL/uL (ref 3.87–5.11)
RDW: 13.9 % (ref 11.5–15.5)
WBC: 19.9 10*3/uL — ABNORMAL HIGH (ref 4.0–10.5)
nRBC: 0 % (ref 0.0–0.2)

## 2024-04-26 LAB — COMPREHENSIVE METABOLIC PANEL WITH GFR
ALT: 15 U/L (ref 0–44)
AST: 19 U/L (ref 15–41)
Albumin: 4.1 g/dL (ref 3.5–5.0)
Alkaline Phosphatase: 107 U/L (ref 38–126)
Anion gap: 10 (ref 5–15)
BUN: 6 mg/dL (ref 6–20)
CO2: 25 mmol/L (ref 22–32)
Calcium: 9 mg/dL (ref 8.9–10.3)
Chloride: 102 mmol/L (ref 98–111)
Creatinine, Ser: 0.84 mg/dL (ref 0.44–1.00)
GFR, Estimated: >60 mL/min (ref >60)
Glucose, Bld: 124 mg/dL — ABNORMAL HIGH (ref 70–99)
Potassium: 3.6 mmol/L (ref 3.5–5.1)
Sodium: 137 mmol/L (ref 135–145)
Total Bilirubin: 0.9 mg/dL (ref 0.0–1.2)
Total Protein: 7.4 g/dL (ref 6.5–8.1)

## 2024-04-26 LAB — PREGNANCY, URINE: Preg Test, Ur: NEGATIVE

## 2024-04-26 MED ORDER — OXYCODONE HCL 5 MG PO TABS: 5.0000 mg | ORAL_TABLET | ORAL | 0 refills | Status: DC | PRN

## 2024-04-26 MED ORDER — METRONIDAZOLE 500 MG PO TABS
500.0000 mg | ORAL_TABLET | Freq: Once | ORAL | Status: AC
  Administered 2024-04-26: 500 mg via ORAL
  Filled 2024-04-26: qty 1

## 2024-04-26 MED ORDER — HYDROMORPHONE HCL 1 MG/ML IJ SOLN
1.0000 mg | Freq: Once | INTRAMUSCULAR | Status: AC
  Administered 2024-04-26: 1 mg via INTRAVENOUS
  Filled 2024-04-26: qty 1

## 2024-04-26 MED ORDER — IOHEXOL 300 MG/ML  SOLN
100.0000 mL | Freq: Once | INTRAMUSCULAR | Status: AC | PRN
Start: 2024-04-26 — End: 2024-04-26
  Administered 2024-04-26: 100 mL via INTRAVENOUS

## 2024-04-26 MED ORDER — ONDANSETRON HCL 4 MG/2ML IJ SOLN
4.0000 mg | Freq: Once | INTRAMUSCULAR | Status: AC
  Administered 2024-04-26: 4 mg via INTRAVENOUS
  Filled 2024-04-26: qty 2

## 2024-04-26 MED ORDER — HYDROCODONE-ACETAMINOPHEN 5-325 MG PO TABS
1.0000 | ORAL_TABLET | Freq: Once | ORAL | Status: AC
  Administered 2024-04-26: 1 via ORAL
  Filled 2024-04-26: qty 1

## 2024-04-26 MED ORDER — CIPROFLOXACIN HCL 500 MG PO TABS: 500.0000 mg | ORAL_TABLET | Freq: Two times a day (BID) | ORAL | 0 refills | Status: AC

## 2024-04-26 MED ORDER — METRONIDAZOLE 500 MG PO TABS: 500.0000 mg | ORAL_TABLET | Freq: Three times a day (TID) | ORAL | 0 refills | Status: AC

## 2024-04-26 MED ORDER — CIPROFLOXACIN HCL 500 MG PO TABS
500.0000 mg | ORAL_TABLET | Freq: Once | ORAL | Status: AC
  Administered 2024-04-26: 500 mg via ORAL
  Filled 2024-04-26: qty 1

## 2024-04-26 NOTE — ED Notes (Signed)
 Patient transported to CT

## 2024-04-26 NOTE — ED Triage Notes (Signed)
 NS received a call that Pt is now at [facility].

## 2024-04-26 NOTE — Discharge Instructions (Addendum)
 It was a pleasure taking care of you today.  Based on your history, physical exam, labs, and imaging I feel you are safe for discharge.  Today you have been prescribed 2 antibiotics, please take them as prescribed and complete the entire course.  You have also been prescribed an outpatient pain medication.  This is a narcotic medication and you should not drive or operate heavy machinery after taking this medication.  Please return to the emergency department or seek further medical care if you experience any of the following symptoms including but not limited to fever, chills, confusion, weakness, worsening/severe pain, nausea/vomiting to the point where you cannot tolerate medication.  With this narcotic pain medication you may also take over-the-counter Tylenol /ibuprofen  for help with pain.  Please follow dosing on the over-the-counter bottle.

## 2024-04-26 NOTE — ED Triage Notes (Signed)
 Left lower abdo pain, x 3 days Started with bad cramping, some bleeding now supsided Hx ovarian cyst

## 2024-04-26 NOTE — ED Provider Notes (Signed)
 [place] EMERGENCY DEPARTMENT AT [facility] Provider Note   CSN: [id number] Arrival date & time: 04/26/24  1453     Patient presents with: Abdominal Pain   Sheena Petty is a 43 y.o. female who presents to the ED with a chief complaint of abdominal pain.  Patient states that the pain started on 04/23/2024 and has increased in severity.  Patient also states that when the pain started she noticed that she had vaginal bleeding similar to her period, this is now subsided.  Patient currently denies any vaginal bleeding, discharge, or urinary symptoms.  Denies fever, chills, chest pain, shortness of breath.  Patient does have a past medical history of diverticulitis and is a active smoker.    Abdominal Pain      Prior to Admission medications   Medication Sig Start Date End Date Taking? Authorizing Provider  ciprofloxacin  (CIPRO ) 500 MG tablet Take 1 tablet (500 mg total) by mouth every 12 (twelve) hours for 5 days. 04/26/24 05/01/24 Yes Sujay Grundman F, PA-C  metroNIDAZOLE  (FLAGYL ) 500 MG tablet Take 1 tablet (500 mg total) by mouth 3 (three) times daily for 5 days. 04/26/24 05/01/24 Yes Albeiro Trompeter F, PA-C  oxyCODONE  (ROXICODONE ) 5 MG immediate release tablet Take 1 tablet (5 mg total) by mouth every 4 (four) hours as needed for severe pain (pain score 7-10). 04/26/24  Yes Brendolyn Stockley F, PA-C    Allergies: Other    Review of Systems  Gastrointestinal:  Positive for abdominal pain.    Updated Vital Signs BP 134/89   Pulse (!) 106   Temp 99.1 F (37.3 C) (Oral)   Resp 16   LMP 04/03/2024 (Approximate)   SpO2 100%   Physical Exam Vitals and nursing note reviewed.  Constitutional:      General: She is awake. She is not in acute distress.    Appearance: She is well-developed. She is ill-appearing. She is not toxic-appearing or diaphoretic.  HENT:     Head: Normocephalic and atraumatic.   Eyes:     General: No scleral icterus.    Extraocular Movements:  Extraocular movements intact.    Cardiovascular:     Rate and Rhythm: Regular rhythm. Tachycardia present.  Pulmonary:     Effort: Pulmonary effort is normal. No respiratory distress.     Breath sounds: Wheezing present. No rhonchi or rales.  Abdominal:     General: Abdomen is flat. There is no distension.     Palpations: Abdomen is soft.     Tenderness: There is abdominal tenderness in the suprapubic area and left lower quadrant. There is no right CVA tenderness, left CVA tenderness or guarding. Negative signs include Murphy's sign and McBurney's sign.   Skin:    General: Skin is warm and dry.     Capillary Refill: Capillary refill takes less than 2 seconds.   Neurological:     General: No focal deficit present.     Mental Status: She is alert and oriented to person, place, and time.     Motor: No weakness.   Psychiatric:        Mood and Affect: Mood normal.        Behavior: Behavior normal. Behavior is cooperative.     (all labs ordered are listed, but only abnormal results are displayed) Labs Reviewed  COMPREHENSIVE METABOLIC PANEL WITH GFR - Abnormal; Notable for the following components:      Result Value   Glucose, Bld 124 (*)    All other components  within normal limits  CBC WITH DIFFERENTIAL/PLATELET - Abnormal; Notable for the following components:   WBC 19.9 (*)    Neutro Abs 15.4 (*)    Monocytes Absolute 1.5 (*)    Abs Immature Granulocytes 0.09 (*)    All other components within normal limits  PREGNANCY, URINE  HCG, SERUM, QUALITATIVE  HCG, SERUM, QUALITATIVE  URINALYSIS, ROUTINE W REFLEX MICROSCOPIC    EKG: None  Radiology: CT ABDOMEN PELVIS W CONTRAST Result Date: 04/26/2024 CLINICAL DATA:  Suprapubic and left lower quadrant pain. EXAM: CT ABDOMEN AND PELVIS WITH CONTRAST TECHNIQUE: Multidetector CT imaging of the abdomen and pelvis was performed using the standard protocol following bolus administration of intravenous contrast. RADIATION DOSE  REDUCTION: This exam was performed according to the departmental dose-optimization program which includes automated exposure control, adjustment of the mA and/or kV according to patient size and/or use of iterative reconstruction technique. CONTRAST:  OMNIPAQUE  IOHEXOL  300 MG/ML  SOLN COMPARISON:  02/22/2023 FINDINGS: Lower chest: No acute abnormality Hepatobiliary: No focal hepatic abnormality. Gallbladder unremarkable. Pancreas: No focal abnormality or ductal dilatation. Spleen: No focal abnormality.  Normal size. Adrenals/Urinary Tract: No adrenal abnormality. No focal renal abnormality. No stones or hydronephrosis. Urinary bladder is unremarkable. Stomach/Bowel: Diffuse colonic diverticulosis. Inflammatory stranding noted around the mid to distal descending colon and proximal sigmoid colon compatible with active diverticulitis. Stomach and small bowel unremarkable. No bowel obstruction. Vascular/Lymphatic: No evidence of aneurysm or adenopathy. Shotty retroperitoneal lymph nodes, unchanged since prior study. Reproductive: Uterus and adnexa unremarkable. No mass. Bilateral tubal ligation clips noted. Other: Small amount of free fluid in the pelvis. No free air. Small supraumbilical ventral hernia containing fat. Musculoskeletal: No acute or significant osseous findings. IMPRESSION: Diffuse colonic diverticulosis. Inflammatory stranding noted around the mid to distal descending colon and proximal sigmoid colon compatible with active diverticulitis. Small supraumbilical ventral hernia containing fat. Electronically Signed   By: Franky Crease M.D.   On: 04/26/2024 18:43   DG Chest Portable 1 View Result Date: 04/26/2024 CLINICAL DATA:  Shortness of breath EXAM: PORTABLE CHEST 1 VIEW COMPARISON:  04/12/2021 FINDINGS: The heart size and mediastinal contours are within normal limits. Both lungs are clear. The visualized skeletal structures are unremarkable. IMPRESSION: Normal study. Electronically Signed   By:  Franky Crease M.D.   On: 04/26/2024 18:40     Procedures   Medications Ordered in the ED  HYDROcodone -acetaminophen  (NORCO/VICODIN) 5-325 MG per tablet 1 tablet (1 tablet Oral Given 04/26/24 1526)  HYDROmorphone  (DILAUDID ) injection 1 mg (1 mg Intravenous Given 04/26/24 1846)  iohexol  (OMNIPAQUE ) 300 MG/ML solution 100 mL (100 mLs Intravenous Contrast Given 04/26/24 1835)  ondansetron  (ZOFRAN ) injection 4 mg (4 mg Intravenous Given 04/26/24 1953)  ciprofloxacin  (CIPRO ) tablet 500 mg (500 mg Oral Given 04/26/24 2049)  metroNIDAZOLE  (FLAGYL ) tablet 500 mg (500 mg Oral Given 04/26/24 2050)                                    Medical Decision Making Amount and/or Complexity of Data Reviewed Labs: ordered. Radiology: ordered.  Risk Prescription drug management.   Patient presents to the ED for concern of abdominal pain, this involves an extensive number of treatment options, and is a complaint that carries with it a high risk of complications and morbidity.  The differential diagnosis includes cholecystitis, cholangitis, appendicitis, diverticulitis, gastritis, pancreatitis, cannabis hyperemesis syndrome, etc.   Co morbidities that complicate the patient evaluation  History of diverticulitis   Additional history obtained:  Additional history obtained from previous emergency department note for treatment of diverticulitis   Lab Tests:  I Ordered, and personally interpreted labs.  The pertinent results include: CBC-white blood cell count 19.9, CMP unremarkable, urine pregnancy negative   Imaging Studies ordered:  I ordered imaging studies including chest x-ray, CT abdomen pelvis I independently visualized and interpreted imaging which showed CT abdomen pelvis significant for diverticulitis, chest x-ray unremarkable no sign of pneumonia I agree with the radiologist interpretation   Cardiac Monitoring:  The patient was maintained on a cardiac monitor.  I personally viewed and  interpreted the cardiac monitored which showed an underlying rhythm of: Sinus   Medicines ordered and prescription drug management:  I ordered medication including Dilaudid  for pain, Zofran  for nausea, antibiotics for diverticulitis  Reevaluation of the patient after these medicines showed that the patient improved I have reviewed the patients home medicines and have made adjustments as needed   Test Considered:  Pelvic exam: declined at this time as patient has denied    Critical Interventions:  none   Problem List / ED Course:  43 year old female, abdominal pain, nausea On physical exam patient in pain, generalized abdominal pain but worse in left lower quadrant Patient denies current urinary or vaginal symptoms, patient denies new sexual partners or abnormal discharge.  I see no indication for a pelvic examination at this time based on patient history and today's workup.  Patient instructed that she can return to the emergency department if she feels that it is necessary at any time, but agrees with plan today to hold off. Lab workup overall unremarkable other than white blood cell count of 19.9 Due to this CT abdomen pelvis ordered, imaging significant for acute diverticulitis Per chart review previous diverticulitis episode was treated outpatient with ciprofloxacin  and Flagyl  successfully, patient vitals stable in the emergency department and able to tolerate p.o. Patient does not have extensive comorbidities for automatic hospital admission due to diverticulitis First round of oral antibiotics given in the emergency department, patient tolerated successfully Precautions given Patient discharged with prescription for outpatient antibiotics as well as narcotic pain medication to help with pain at home Instructed to follow-up with primary care and specialist as scheduled, sooner if symptoms warrant. Patient educated on possible side effects of antibiotic medication including  ciprofloxacin    Reevaluation:  After the interventions noted above, I reevaluated the patient and found that they have :improved   Social Determinants of Health:  None   Dispostion:  After consideration of the diagnostic results and the patients response to treatment, I feel that the patent would benefit from discharge and outpatient therapy with antibiotics as prescribed, as needed pain meds.  Return precautions given.  Patient discharged..       Final diagnoses:  Diverticulitis    ED Discharge Orders          Ordered    oxyCODONE  (ROXICODONE ) 5 MG immediate release tablet  Every 4 hours PRN        04/26/24 2112    ciprofloxacin  (CIPRO ) 500 MG tablet  Every 12 hours        04/26/24 2112    metroNIDAZOLE  (FLAGYL ) 500 MG tablet  3 times daily        04/26/24 2112               Janetta Terrall FALCON, PA-C 04/27/24 0028    Francesca Elsie CROME, MD 04/27/24 [phone]

## 2024-04-27 NOTE — Telephone Encounter (Signed)
 Pt LVM on GS triage line stating she went to the ED last night and was diagnosed with diverticulitis and placed on antibiotics. Pt stated she is wanting to know if she should continue with surgery tomorrow or if she needs to reschedule.  Please advise if pt should continue with surgery or reschedule.

## 2024-06-05 ENCOUNTER — Encounter (HOSPITAL_BASED_OUTPATIENT_CLINIC_OR_DEPARTMENT_OTHER): Payer: Self-pay

## 2024-06-05 ENCOUNTER — Emergency Department (HOSPITAL_BASED_OUTPATIENT_CLINIC_OR_DEPARTMENT_OTHER)
Admission: EM | Admit: 2024-06-05 | Discharge: 2024-06-05 | Disposition: A | Discharge status: Home / Self Care | Attending: Emergency Medicine | Admitting: Emergency Medicine

## 2024-06-05 ENCOUNTER — Other Ambulatory Visit: Payer: Self-pay

## 2024-06-05 DIAGNOSIS — K5792 Diverticulitis of intestine, part unspecified, without perforation or abscess without bleeding: Secondary | ICD-10-CM | POA: Diagnosis not present

## 2024-06-05 DIAGNOSIS — R1032 Left lower quadrant pain: Secondary | ICD-10-CM

## 2024-06-05 LAB — URINALYSIS, ROUTINE W REFLEX MICROSCOPIC
Bacteria, UA: NONE SEEN
Bilirubin Urine: NEGATIVE
Glucose, UA: NEGATIVE mg/dL
Ketones, ur: NEGATIVE mg/dL
Leukocytes,Ua: NEGATIVE
Nitrite: NEGATIVE
Specific Gravity, Urine: 1.026 (ref 1.005–1.030)
pH: 7.5 (ref 5.0–8.0)

## 2024-06-05 LAB — CBC
HCT: 40.9 % (ref 36.0–46.0)
Hemoglobin: 13.6 g/dL (ref 12.0–15.0)
MCH: 29.3 pg (ref 26.0–34.0)
MCHC: 33.3 g/dL (ref 30.0–36.0)
MCV: 88.1 fL (ref 80.0–100.0)
Platelets: 244 K/uL (ref 150–400)
RBC: 4.64 MIL/uL (ref 3.87–5.11)
RDW: 14.7 % (ref 11.5–15.5)
WBC: 9.4 K/uL (ref 4.0–10.5)
nRBC: 0 % (ref 0.0–0.2)

## 2024-06-05 LAB — COMPREHENSIVE METABOLIC PANEL WITH GFR
ALT: 11 U/L (ref 0–44)
AST: 17 U/L (ref 15–41)
Albumin: 3.9 g/dL (ref 3.5–5.0)
Alkaline Phosphatase: 80 U/L (ref 38–126)
Anion gap: 10 (ref 5–15)
BUN: 10 mg/dL (ref 6–20)
CO2: 25 mmol/L (ref 22–32)
Calcium: 8.5 mg/dL — ABNORMAL LOW (ref 8.9–10.3)
Chloride: 104 mmol/L (ref 98–111)
Creatinine, Ser: 0.81 mg/dL (ref 0.44–1.00)
GFR, Estimated: >60 mL/min (ref >60)
Glucose, Bld: 101 mg/dL — ABNORMAL HIGH (ref 70–99)
Potassium: 3.8 mmol/L (ref 3.5–5.1)
Sodium: 138 mmol/L (ref 135–145)
Total Bilirubin: 0.4 mg/dL (ref 0.0–1.2)
Total Protein: 6.6 g/dL (ref 6.5–8.1)

## 2024-06-05 LAB — LIPASE, BLOOD: Lipase: 19 U/L (ref 11–51)

## 2024-06-05 LAB — PREGNANCY, URINE: Preg Test, Ur: NEGATIVE

## 2024-06-05 MED ORDER — METRONIDAZOLE 500 MG PO TABS
500.0000 mg | ORAL_TABLET | Freq: Once | ORAL | Status: AC
Start: 2024-06-05 — End: 2024-06-05
  Administered 2024-06-05: 500 mg via ORAL
  Filled 2024-06-05: qty 1

## 2024-06-05 MED ORDER — METRONIDAZOLE 500 MG PO TABS: 500.0000 mg | ORAL_TABLET | Freq: Three times a day (TID) | ORAL | 0 refills | Status: AC

## 2024-06-05 MED ORDER — CIPROFLOXACIN HCL 500 MG PO TABS
500.0000 mg | ORAL_TABLET | Freq: Once | ORAL | Status: AC
  Administered 2024-06-05: 500 mg via ORAL
  Filled 2024-06-05: qty 1

## 2024-06-05 MED ORDER — CIPROFLOXACIN HCL 500 MG PO TABS: 500.0000 mg | ORAL_TABLET | Freq: Two times a day (BID) | ORAL | 0 refills | Status: AC

## 2024-06-05 MED ORDER — TRAMADOL HCL 50 MG PO TABS: 50.0000 mg | ORAL_TABLET | Freq: Four times a day (QID) | ORAL | 0 refills | Status: AC | PRN

## 2024-06-05 NOTE — Discharge Instructions (Addendum)
 It was a pleasure taking care of you today.  Based on your history, physical exam, and labs I feel you are safe for discharge.  Due to your history and current symptoms I do feel that the most likely diagnosis at this time is diverticulitis for your symptoms.  Today you have been prescribed a course of outpatient antibiotics, please take these as prescribed and complete the entire course.  You have also been prescribed an outpatient pain medication called tramadol , please take as instructed. This medication can be taken in addition to over the counter tylenol  which you are already taking. Please do not exceed the daily limit of 4000mg  of tylenol /day.  You have also been given a GI referral, please call and make an appointment as soon as possible since you are having recurrent episodes of diverticulitis.  If you experience any of the following symptoms included but not limited to fever, chills, worsening abdominal pain, urinary symptoms, flank pain, blood in stool, chest pain, shortness of breath, or other concerning symptom please return to the emergency department or seek further medical care. If symptoms persist or worsen recommend follow-up with primary care in 48 hours.

## 2024-06-05 NOTE — ED Triage Notes (Signed)
 Pt recently dx w/ diverticulitis. Pt reports similar symptoms. Pt reports LLQ abd pain. Pt denies any N/V/D.

## 2024-06-05 NOTE — ED Provider Notes (Signed)
 [place] EMERGENCY DEPARTMENT AT [facility] Provider Note   CSN: [id number] Arrival date & time: 06/05/24  1629     Patient presents with: Abdominal Pain   Sheena Petty is a 42 y.o. female who presents to the emergency department with a chief complaint of left lower quadrant abdominal pain.  Patient states that she was recently diagnosed with diverticulitis a few months back and that this is what this pain feels like.  Denies fever, chills, nausea, vomiting, diarrhea.  Patient states that the pain started yesterday and was a moderate crampy pain, but then the pain intensified today so she decided to come into the emergency department.  Denies chest pain or shortness of breath.  Denies urinary symptoms however does state that she has possibly some mild constipation.  Denies hematochezia. Patient has a past medical history significant for diverticulitis.  {Add pertinent medical, surgical, social history, OB history to HPI:32947}  Abdominal Pain      Prior to Admission medications   Medication Sig Start Date End Date Taking? Authorizing Provider  oxyCODONE  (ROXICODONE ) 5 MG immediate release tablet Take 1 tablet (5 mg total) by mouth every 4 (four) hours as needed for severe pain (pain score 7-10). 04/26/24   Ziggy Chanthavong F, PA-C    Allergies: Other    Review of Systems  Gastrointestinal:  Positive for abdominal pain.    Updated Vital Signs BP (!) 156/101   Pulse 83   Temp 97.9 F (36.6 C) (Oral)   Resp 18   Ht 5' 2 (1.575 m)   Wt 83.9 kg   LMP 05/17/2024   SpO2 100%   BMI 33.84 kg/m   Physical Exam Vitals and nursing note reviewed.  Constitutional:      General: She is not in acute distress.    Appearance: She is well-developed. She is not ill-appearing, toxic-appearing or diaphoretic.  HENT:     Head: Normocephalic and atraumatic.  Cardiovascular:     Rate and Rhythm: Normal rate and regular rhythm.  Pulmonary:     Effort: Pulmonary effort is  normal. No respiratory distress.     Breath sounds: Normal breath sounds. No wheezing, rhonchi or rales.  Abdominal:     General: Abdomen is flat. There is no distension.     Palpations: Abdomen is soft. There is no mass.     Tenderness: There is abdominal tenderness in the left lower quadrant. There is no right CVA tenderness, left CVA tenderness, guarding or rebound.  Skin:    General: Skin is warm.     Capillary Refill: Capillary refill takes less than 2 seconds.  Neurological:     General: No focal deficit present.     Mental Status: She is alert and oriented to person, place, and time.  Psychiatric:        Mood and Affect: Mood normal.        Behavior: Behavior normal.     (all labs ordered are listed, but only abnormal results are displayed) Labs Reviewed  URINALYSIS, ROUTINE W REFLEX MICROSCOPIC - Abnormal; Notable for the following components:      Result Value   Hgb urine dipstick SMALL (*)    Protein, ur TRACE (*)    All other components within normal limits  CBC  PREGNANCY, URINE  LIPASE, BLOOD  COMPREHENSIVE METABOLIC PANEL WITH GFR    EKG: None  Radiology: No results found.  {Document cardiac monitor, telemetry assessment procedure when appropriate:32947} Procedures   Medications Ordered in the  ED - No data to display    {Click here for ABCD2, HEART and other calculators REFRESH Note before signing:1}                              Medical Decision Making Amount and/or Complexity of Data Reviewed Labs: ordered.   Patient presents to the ED for concern of left lower quadrant abdominal pain, this involves an extensive number of treatment options, and is a complaint that carries with it a high risk of complications and morbidity.  The differential diagnosis includes diverticulitis, gastritis, constipation, urinary tract infection, kidney stone, appendicitis, cholecystitis, cholangitis, pyelonephritis, musculoskeletal injury, trauma/injury, etc.   Co  morbidities that complicate the patient evaluation  History of diverticulitis   Additional history obtained:  I reviewed the previous note from where I saw this patient on 04/26/2024 where the patient was seen for left lower quadrant abdominal pain, CT abdomen pelvis was ordered and patient was diagnosed with acute diverticulitis and prescribed outpatient pain control as well as ciprofloxacin  and Flagyl  for antibiotic therapy, patient states that pain and symptoms subsided after last visit until now and it seems like they have flared up again   Lab Tests:  I Ordered, and personally interpreted labs.  The pertinent results include: CBC unremarkable, CMP remarkable only for slightly decreased calcium of 8.5, lipase unremarkable, UA shows trace protein and small hemoglobin however no evidence of urinary tract infection   Imaging Studies ordered:  I ordered imaging studies including ***  I independently visualized and interpreted imaging which showed *** I agree with the radiologist interpretation   Medicines ordered and prescription drug management:  I ordered medication including ***  for ***  Reevaluation of the patient after these medicines showed that the patient {resolved/improved/worsened:23923::improved} I have reviewed the patients home medicines and have made adjustments as needed   Test Considered:  ***   Critical Interventions:  ***   Consultations Obtained:  I requested consultation with the ***,  and discussed lab and imaging findings as well as pertinent plan - they recommend: ***   Problem List / ED Course:  43 year old female, left lower quadrant nominal pain, recent history of diverticulitis, treated with outpatient antibiotics as well as pain control, symptoms subsided until today Vital signs stable, patient afebrile, no nausea, vomiting, or diarrhea, no hematochezia, only left lower quadrant abdominal pressure versus pain Left lower quadrant abdomen  tender to palpation, no rebound or guarding Lab work overall reassuring, no elevated white blood cell count, CMP remarkable only for slightly decreased calcium, lipase unremarkable, no evidence of UTI, pregnancy negative   Reevaluation:  After the interventions noted above, I reevaluated the patient and found that they have :{resolved/improved/worsened:23923::improved}   Social Determinants of Health:  none   Dispostion:  After consideration of the diagnostic results and the patients response to treatment, I feel that the patent would benefit from ***.    {Document critical care time when appropriate  Document review of labs and clinical decision tools ie CHADS2VASC2, etc  Document your independent review of radiology images and any outside records  Document your discussion with family members, caretakers and with consultants  Document social determinants of health affecting pt's care  Document your decision making why or why not admission, treatments were needed:32947:::1}   Final diagnoses:  None    ED Discharge Orders     None
